# Patient Record
Sex: Female | Born: 1945 | Race: White | Hispanic: No | Marital: Married | Smoking: Never smoker
Health system: Southern US, Community
[De-identification: ages and names within clinical notes are randomized; demographics above are authoritative.]

## PROBLEM LIST (undated history)

## (undated) DIAGNOSIS — K219 Gastro-esophageal reflux disease without esophagitis: Secondary | ICD-10-CM

## (undated) DIAGNOSIS — M1990 Unspecified osteoarthritis, unspecified site: Secondary | ICD-10-CM

## (undated) DIAGNOSIS — R519 Headache, unspecified: Secondary | ICD-10-CM

## (undated) DIAGNOSIS — F419 Anxiety disorder, unspecified: Secondary | ICD-10-CM

## (undated) DIAGNOSIS — F329 Major depressive disorder, single episode, unspecified: Secondary | ICD-10-CM

## (undated) DIAGNOSIS — R112 Nausea with vomiting, unspecified: Secondary | ICD-10-CM

## (undated) DIAGNOSIS — R51 Headache: Secondary | ICD-10-CM

## (undated) DIAGNOSIS — F32A Depression, unspecified: Secondary | ICD-10-CM

## (undated) DIAGNOSIS — Z9889 Other specified postprocedural states: Secondary | ICD-10-CM

## (undated) HISTORY — PX: BREAST SURGERY: SHX581

---
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
## 1980-12-03 HISTORY — PX: OVARIAN CYST REMOVAL: SHX89

## 2011-12-04 HISTORY — PX: CHOLECYSTECTOMY: SHX55

## 2011-12-04 HISTORY — PX: APPENDECTOMY: SHX54

## 2012-12-10 DIAGNOSIS — M542 Cervicalgia: Secondary | ICD-10-CM | POA: Diagnosis not present

## 2012-12-10 DIAGNOSIS — M5412 Radiculopathy, cervical region: Secondary | ICD-10-CM | POA: Diagnosis not present

## 2012-12-10 DIAGNOSIS — M47812 Spondylosis without myelopathy or radiculopathy, cervical region: Secondary | ICD-10-CM | POA: Diagnosis not present

## 2013-04-01 DIAGNOSIS — D229 Melanocytic nevi, unspecified: Secondary | ICD-10-CM

## 2013-04-01 DIAGNOSIS — D485 Neoplasm of uncertain behavior of skin: Secondary | ICD-10-CM | POA: Diagnosis not present

## 2013-04-01 DIAGNOSIS — L570 Actinic keratosis: Secondary | ICD-10-CM | POA: Diagnosis not present

## 2013-04-01 HISTORY — DX: Melanocytic nevi, unspecified: D22.9

## 2013-04-15 DIAGNOSIS — N644 Mastodynia: Secondary | ICD-10-CM | POA: Diagnosis not present

## 2013-04-15 DIAGNOSIS — N6459 Other signs and symptoms in breast: Secondary | ICD-10-CM | POA: Diagnosis not present

## 2013-06-17 DIAGNOSIS — M47812 Spondylosis without myelopathy or radiculopathy, cervical region: Secondary | ICD-10-CM | POA: Diagnosis not present

## 2013-06-17 DIAGNOSIS — M542 Cervicalgia: Secondary | ICD-10-CM | POA: Diagnosis not present

## 2013-06-24 DIAGNOSIS — D239 Other benign neoplasm of skin, unspecified: Secondary | ICD-10-CM | POA: Diagnosis not present

## 2013-06-24 DIAGNOSIS — L570 Actinic keratosis: Secondary | ICD-10-CM | POA: Diagnosis not present

## 2013-09-15 DIAGNOSIS — Z124 Encounter for screening for malignant neoplasm of cervix: Secondary | ICD-10-CM | POA: Diagnosis not present

## 2013-09-15 DIAGNOSIS — N899 Noninflammatory disorder of vagina, unspecified: Secondary | ICD-10-CM | POA: Diagnosis not present

## 2013-09-15 DIAGNOSIS — Z01419 Encounter for gynecological examination (general) (routine) without abnormal findings: Secondary | ICD-10-CM | POA: Diagnosis not present

## 2013-09-16 DIAGNOSIS — M542 Cervicalgia: Secondary | ICD-10-CM | POA: Diagnosis not present

## 2013-09-16 DIAGNOSIS — M47812 Spondylosis without myelopathy or radiculopathy, cervical region: Secondary | ICD-10-CM | POA: Diagnosis not present

## 2013-10-14 DIAGNOSIS — L820 Inflamed seborrheic keratosis: Secondary | ICD-10-CM | POA: Diagnosis not present

## 2013-10-14 DIAGNOSIS — L570 Actinic keratosis: Secondary | ICD-10-CM | POA: Diagnosis not present

## 2013-10-27 DIAGNOSIS — M542 Cervicalgia: Secondary | ICD-10-CM | POA: Diagnosis not present

## 2013-10-27 DIAGNOSIS — M47812 Spondylosis without myelopathy or radiculopathy, cervical region: Secondary | ICD-10-CM | POA: Diagnosis not present

## 2013-11-10 DIAGNOSIS — Z1231 Encounter for screening mammogram for malignant neoplasm of breast: Secondary | ICD-10-CM | POA: Diagnosis not present

## 2013-12-01 DIAGNOSIS — M542 Cervicalgia: Secondary | ICD-10-CM | POA: Diagnosis not present

## 2013-12-01 DIAGNOSIS — M47812 Spondylosis without myelopathy or radiculopathy, cervical region: Secondary | ICD-10-CM | POA: Diagnosis not present

## 2013-12-15 DIAGNOSIS — L570 Actinic keratosis: Secondary | ICD-10-CM | POA: Diagnosis not present

## 2014-01-27 DIAGNOSIS — M5412 Radiculopathy, cervical region: Secondary | ICD-10-CM | POA: Diagnosis not present

## 2014-01-27 DIAGNOSIS — M542 Cervicalgia: Secondary | ICD-10-CM | POA: Diagnosis not present

## 2014-01-27 DIAGNOSIS — M47812 Spondylosis without myelopathy or radiculopathy, cervical region: Secondary | ICD-10-CM | POA: Diagnosis not present

## 2014-02-09 DIAGNOSIS — M5412 Radiculopathy, cervical region: Secondary | ICD-10-CM | POA: Diagnosis not present

## 2014-02-09 DIAGNOSIS — M542 Cervicalgia: Secondary | ICD-10-CM | POA: Diagnosis not present

## 2014-02-12 DIAGNOSIS — L570 Actinic keratosis: Secondary | ICD-10-CM | POA: Diagnosis not present

## 2014-03-07 DIAGNOSIS — S93409A Sprain of unspecified ligament of unspecified ankle, initial encounter: Secondary | ICD-10-CM | POA: Diagnosis not present

## 2014-03-07 DIAGNOSIS — M25579 Pain in unspecified ankle and joints of unspecified foot: Secondary | ICD-10-CM | POA: Diagnosis not present

## 2014-03-07 DIAGNOSIS — W010XXA Fall on same level from slipping, tripping and stumbling without subsequent striking against object, initial encounter: Secondary | ICD-10-CM | POA: Diagnosis not present

## 2014-03-07 DIAGNOSIS — S2249XA Multiple fractures of ribs, unspecified side, initial encounter for closed fracture: Secondary | ICD-10-CM | POA: Diagnosis not present

## 2014-03-07 DIAGNOSIS — Z87891 Personal history of nicotine dependence: Secondary | ICD-10-CM | POA: Diagnosis not present

## 2014-03-07 DIAGNOSIS — Y92009 Unspecified place in unspecified non-institutional (private) residence as the place of occurrence of the external cause: Secondary | ICD-10-CM | POA: Diagnosis not present

## 2014-03-07 DIAGNOSIS — S2239XA Fracture of one rib, unspecified side, initial encounter for closed fracture: Secondary | ICD-10-CM | POA: Diagnosis not present

## 2014-03-11 DIAGNOSIS — S9000XA Contusion of unspecified ankle, initial encounter: Secondary | ICD-10-CM | POA: Diagnosis not present

## 2014-03-11 DIAGNOSIS — S2249XA Multiple fractures of ribs, unspecified side, initial encounter for closed fracture: Secondary | ICD-10-CM | POA: Diagnosis not present

## 2014-03-18 DIAGNOSIS — R109 Unspecified abdominal pain: Secondary | ICD-10-CM | POA: Diagnosis not present

## 2014-03-18 DIAGNOSIS — R350 Frequency of micturition: Secondary | ICD-10-CM | POA: Diagnosis not present

## 2014-03-18 DIAGNOSIS — S2239XA Fracture of one rib, unspecified side, initial encounter for closed fracture: Secondary | ICD-10-CM | POA: Diagnosis not present

## 2014-03-18 DIAGNOSIS — N3000 Acute cystitis without hematuria: Secondary | ICD-10-CM | POA: Diagnosis not present

## 2014-03-19 DIAGNOSIS — M47812 Spondylosis without myelopathy or radiculopathy, cervical region: Secondary | ICD-10-CM | POA: Diagnosis not present

## 2014-03-19 DIAGNOSIS — M5412 Radiculopathy, cervical region: Secondary | ICD-10-CM | POA: Diagnosis not present

## 2014-03-19 DIAGNOSIS — Q7649 Other congenital malformations of spine, not associated with scoliosis: Secondary | ICD-10-CM | POA: Diagnosis not present

## 2014-03-19 DIAGNOSIS — M542 Cervicalgia: Secondary | ICD-10-CM | POA: Diagnosis not present

## 2014-03-22 ENCOUNTER — Other Ambulatory Visit: Payer: Self-pay | Admitting: Orthopedic Surgery

## 2014-03-22 DIAGNOSIS — M542 Cervicalgia: Secondary | ICD-10-CM

## 2014-03-23 ENCOUNTER — Ambulatory Visit
Admission: RE | Admit: 2014-03-23 | Discharge: 2014-03-23 | Disposition: A | Discharge status: Home / Self Care | Payer: Medicare Other | Source: Ambulatory Visit | Attending: Orthopedic Surgery | Admitting: Orthopedic Surgery

## 2014-03-23 DIAGNOSIS — M542 Cervicalgia: Secondary | ICD-10-CM

## 2014-03-23 DIAGNOSIS — M47817 Spondylosis without myelopathy or radiculopathy, lumbosacral region: Secondary | ICD-10-CM | POA: Diagnosis not present

## 2014-04-07 DIAGNOSIS — S2239XA Fracture of one rib, unspecified side, initial encounter for closed fracture: Secondary | ICD-10-CM | POA: Diagnosis not present

## 2014-04-07 DIAGNOSIS — S93409A Sprain of unspecified ligament of unspecified ankle, initial encounter: Secondary | ICD-10-CM | POA: Diagnosis not present

## 2014-04-09 DIAGNOSIS — M4712 Other spondylosis with myelopathy, cervical region: Secondary | ICD-10-CM | POA: Diagnosis not present

## 2014-04-09 DIAGNOSIS — M5412 Radiculopathy, cervical region: Secondary | ICD-10-CM | POA: Diagnosis not present

## 2014-04-14 DIAGNOSIS — D485 Neoplasm of uncertain behavior of skin: Secondary | ICD-10-CM | POA: Diagnosis not present

## 2014-04-14 DIAGNOSIS — L570 Actinic keratosis: Secondary | ICD-10-CM | POA: Diagnosis not present

## 2014-04-14 DIAGNOSIS — M5412 Radiculopathy, cervical region: Secondary | ICD-10-CM | POA: Diagnosis not present

## 2014-04-14 DIAGNOSIS — M4712 Other spondylosis with myelopathy, cervical region: Secondary | ICD-10-CM | POA: Diagnosis not present

## 2014-04-14 DIAGNOSIS — M542 Cervicalgia: Secondary | ICD-10-CM | POA: Diagnosis not present

## 2014-06-15 DIAGNOSIS — M25579 Pain in unspecified ankle and joints of unspecified foot: Secondary | ICD-10-CM | POA: Diagnosis not present

## 2014-06-15 DIAGNOSIS — M79609 Pain in unspecified limb: Secondary | ICD-10-CM | POA: Diagnosis not present

## 2014-06-15 DIAGNOSIS — S93409A Sprain of unspecified ligament of unspecified ankle, initial encounter: Secondary | ICD-10-CM | POA: Diagnosis not present

## 2014-06-15 DIAGNOSIS — S93609A Unspecified sprain of unspecified foot, initial encounter: Secondary | ICD-10-CM | POA: Diagnosis not present

## 2014-06-24 DIAGNOSIS — M25579 Pain in unspecified ankle and joints of unspecified foot: Secondary | ICD-10-CM | POA: Diagnosis not present

## 2014-06-30 DIAGNOSIS — M19079 Primary osteoarthritis, unspecified ankle and foot: Secondary | ICD-10-CM | POA: Diagnosis not present

## 2014-06-30 DIAGNOSIS — M79609 Pain in unspecified limb: Secondary | ICD-10-CM | POA: Diagnosis not present

## 2014-06-30 DIAGNOSIS — Z5189 Encounter for other specified aftercare: Secondary | ICD-10-CM | POA: Diagnosis not present

## 2014-07-21 DIAGNOSIS — M19079 Primary osteoarthritis, unspecified ankle and foot: Secondary | ICD-10-CM | POA: Diagnosis not present

## 2014-07-21 DIAGNOSIS — M7750 Other enthesopathy of unspecified foot: Secondary | ICD-10-CM | POA: Diagnosis not present

## 2014-07-27 DIAGNOSIS — M19079 Primary osteoarthritis, unspecified ankle and foot: Secondary | ICD-10-CM | POA: Diagnosis not present

## 2014-07-27 DIAGNOSIS — R262 Difficulty in walking, not elsewhere classified: Secondary | ICD-10-CM | POA: Diagnosis not present

## 2014-07-27 DIAGNOSIS — M25569 Pain in unspecified knee: Secondary | ICD-10-CM | POA: Diagnosis not present

## 2014-07-30 DIAGNOSIS — M25569 Pain in unspecified knee: Secondary | ICD-10-CM | POA: Diagnosis not present

## 2014-07-30 DIAGNOSIS — R262 Difficulty in walking, not elsewhere classified: Secondary | ICD-10-CM | POA: Diagnosis not present

## 2014-07-30 DIAGNOSIS — M19079 Primary osteoarthritis, unspecified ankle and foot: Secondary | ICD-10-CM | POA: Diagnosis not present

## 2014-08-02 DIAGNOSIS — M25569 Pain in unspecified knee: Secondary | ICD-10-CM | POA: Diagnosis not present

## 2014-08-02 DIAGNOSIS — M19079 Primary osteoarthritis, unspecified ankle and foot: Secondary | ICD-10-CM | POA: Diagnosis not present

## 2014-08-02 DIAGNOSIS — R262 Difficulty in walking, not elsewhere classified: Secondary | ICD-10-CM | POA: Diagnosis not present

## 2014-08-04 DIAGNOSIS — M25569 Pain in unspecified knee: Secondary | ICD-10-CM | POA: Diagnosis not present

## 2014-08-04 DIAGNOSIS — M19079 Primary osteoarthritis, unspecified ankle and foot: Secondary | ICD-10-CM | POA: Diagnosis not present

## 2014-08-04 DIAGNOSIS — R262 Difficulty in walking, not elsewhere classified: Secondary | ICD-10-CM | POA: Diagnosis not present

## 2014-08-11 DIAGNOSIS — M19079 Primary osteoarthritis, unspecified ankle and foot: Secondary | ICD-10-CM | POA: Diagnosis not present

## 2014-08-11 DIAGNOSIS — R262 Difficulty in walking, not elsewhere classified: Secondary | ICD-10-CM | POA: Diagnosis not present

## 2014-08-11 DIAGNOSIS — M25569 Pain in unspecified knee: Secondary | ICD-10-CM | POA: Diagnosis not present

## 2014-08-13 DIAGNOSIS — R262 Difficulty in walking, not elsewhere classified: Secondary | ICD-10-CM | POA: Diagnosis not present

## 2014-08-13 DIAGNOSIS — M19079 Primary osteoarthritis, unspecified ankle and foot: Secondary | ICD-10-CM | POA: Diagnosis not present

## 2014-08-13 DIAGNOSIS — M25569 Pain in unspecified knee: Secondary | ICD-10-CM | POA: Diagnosis not present

## 2014-08-16 DIAGNOSIS — M19079 Primary osteoarthritis, unspecified ankle and foot: Secondary | ICD-10-CM | POA: Diagnosis not present

## 2014-08-16 DIAGNOSIS — R262 Difficulty in walking, not elsewhere classified: Secondary | ICD-10-CM | POA: Diagnosis not present

## 2014-08-16 DIAGNOSIS — M25569 Pain in unspecified knee: Secondary | ICD-10-CM | POA: Diagnosis not present

## 2014-08-18 DIAGNOSIS — M19079 Primary osteoarthritis, unspecified ankle and foot: Secondary | ICD-10-CM | POA: Diagnosis not present

## 2014-08-18 DIAGNOSIS — R262 Difficulty in walking, not elsewhere classified: Secondary | ICD-10-CM | POA: Diagnosis not present

## 2014-08-18 DIAGNOSIS — M25569 Pain in unspecified knee: Secondary | ICD-10-CM | POA: Diagnosis not present

## 2014-08-20 DIAGNOSIS — R262 Difficulty in walking, not elsewhere classified: Secondary | ICD-10-CM | POA: Diagnosis not present

## 2014-08-20 DIAGNOSIS — M19079 Primary osteoarthritis, unspecified ankle and foot: Secondary | ICD-10-CM | POA: Diagnosis not present

## 2014-08-20 DIAGNOSIS — M25569 Pain in unspecified knee: Secondary | ICD-10-CM | POA: Diagnosis not present

## 2014-08-23 DIAGNOSIS — M19079 Primary osteoarthritis, unspecified ankle and foot: Secondary | ICD-10-CM | POA: Diagnosis not present

## 2014-08-23 DIAGNOSIS — M7750 Other enthesopathy of unspecified foot: Secondary | ICD-10-CM | POA: Diagnosis not present

## 2014-09-10 DIAGNOSIS — M25572 Pain in left ankle and joints of left foot: Secondary | ICD-10-CM | POA: Diagnosis not present

## 2014-09-10 DIAGNOSIS — M6281 Muscle weakness (generalized): Secondary | ICD-10-CM | POA: Diagnosis not present

## 2014-09-10 DIAGNOSIS — R262 Difficulty in walking, not elsewhere classified: Secondary | ICD-10-CM | POA: Diagnosis not present

## 2014-09-15 DIAGNOSIS — M6281 Muscle weakness (generalized): Secondary | ICD-10-CM | POA: Diagnosis not present

## 2014-09-15 DIAGNOSIS — M25572 Pain in left ankle and joints of left foot: Secondary | ICD-10-CM | POA: Diagnosis not present

## 2014-09-15 DIAGNOSIS — R262 Difficulty in walking, not elsewhere classified: Secondary | ICD-10-CM | POA: Diagnosis not present

## 2014-09-16 DIAGNOSIS — M6281 Muscle weakness (generalized): Secondary | ICD-10-CM | POA: Diagnosis not present

## 2014-09-16 DIAGNOSIS — M25572 Pain in left ankle and joints of left foot: Secondary | ICD-10-CM | POA: Diagnosis not present

## 2014-09-16 DIAGNOSIS — R262 Difficulty in walking, not elsewhere classified: Secondary | ICD-10-CM | POA: Diagnosis not present

## 2014-09-21 DIAGNOSIS — M6281 Muscle weakness (generalized): Secondary | ICD-10-CM | POA: Diagnosis not present

## 2014-09-21 DIAGNOSIS — M25572 Pain in left ankle and joints of left foot: Secondary | ICD-10-CM | POA: Diagnosis not present

## 2014-09-21 DIAGNOSIS — R262 Difficulty in walking, not elsewhere classified: Secondary | ICD-10-CM | POA: Diagnosis not present

## 2014-09-22 DIAGNOSIS — M25572 Pain in left ankle and joints of left foot: Secondary | ICD-10-CM | POA: Diagnosis not present

## 2014-09-22 DIAGNOSIS — R262 Difficulty in walking, not elsewhere classified: Secondary | ICD-10-CM | POA: Diagnosis not present

## 2014-09-22 DIAGNOSIS — M6281 Muscle weakness (generalized): Secondary | ICD-10-CM | POA: Diagnosis not present

## 2014-09-27 DIAGNOSIS — R262 Difficulty in walking, not elsewhere classified: Secondary | ICD-10-CM | POA: Diagnosis not present

## 2014-09-27 DIAGNOSIS — M25572 Pain in left ankle and joints of left foot: Secondary | ICD-10-CM | POA: Diagnosis not present

## 2014-09-27 DIAGNOSIS — M6281 Muscle weakness (generalized): Secondary | ICD-10-CM | POA: Diagnosis not present

## 2014-09-28 DIAGNOSIS — Z23 Encounter for immunization: Secondary | ICD-10-CM | POA: Diagnosis not present

## 2014-09-28 DIAGNOSIS — Z01419 Encounter for gynecological examination (general) (routine) without abnormal findings: Secondary | ICD-10-CM | POA: Diagnosis not present

## 2014-09-28 DIAGNOSIS — Z124 Encounter for screening for malignant neoplasm of cervix: Secondary | ICD-10-CM | POA: Diagnosis not present

## 2014-09-28 DIAGNOSIS — R6889 Other general symptoms and signs: Secondary | ICD-10-CM | POA: Diagnosis not present

## 2014-09-28 DIAGNOSIS — Z1151 Encounter for screening for human papillomavirus (HPV): Secondary | ICD-10-CM | POA: Diagnosis not present

## 2014-09-30 DIAGNOSIS — M25572 Pain in left ankle and joints of left foot: Secondary | ICD-10-CM | POA: Diagnosis not present

## 2014-09-30 DIAGNOSIS — M6281 Muscle weakness (generalized): Secondary | ICD-10-CM | POA: Diagnosis not present

## 2014-09-30 DIAGNOSIS — R262 Difficulty in walking, not elsewhere classified: Secondary | ICD-10-CM | POA: Diagnosis not present

## 2014-10-04 DIAGNOSIS — M7672 Peroneal tendinitis, left leg: Secondary | ICD-10-CM | POA: Diagnosis not present

## 2014-10-04 DIAGNOSIS — M19072 Primary osteoarthritis, left ankle and foot: Secondary | ICD-10-CM | POA: Diagnosis not present

## 2014-11-04 DIAGNOSIS — C44320 Squamous cell carcinoma of skin of unspecified parts of face: Secondary | ICD-10-CM | POA: Diagnosis not present

## 2014-11-04 DIAGNOSIS — L570 Actinic keratosis: Secondary | ICD-10-CM | POA: Diagnosis not present

## 2014-11-04 DIAGNOSIS — C4492 Squamous cell carcinoma of skin, unspecified: Secondary | ICD-10-CM

## 2014-11-04 DIAGNOSIS — D485 Neoplasm of uncertain behavior of skin: Secondary | ICD-10-CM | POA: Diagnosis not present

## 2014-11-04 DIAGNOSIS — C44321 Squamous cell carcinoma of skin of nose: Secondary | ICD-10-CM | POA: Diagnosis not present

## 2014-11-04 HISTORY — DX: Squamous cell carcinoma of skin, unspecified: C44.92

## 2014-11-08 DIAGNOSIS — J0110 Acute frontal sinusitis, unspecified: Secondary | ICD-10-CM | POA: Diagnosis not present

## 2014-11-16 DIAGNOSIS — E2839 Other primary ovarian failure: Secondary | ICD-10-CM | POA: Diagnosis not present

## 2014-11-16 DIAGNOSIS — Z1231 Encounter for screening mammogram for malignant neoplasm of breast: Secondary | ICD-10-CM | POA: Diagnosis not present

## 2014-12-09 DIAGNOSIS — J329 Chronic sinusitis, unspecified: Secondary | ICD-10-CM | POA: Diagnosis not present

## 2014-12-15 DIAGNOSIS — J3489 Other specified disorders of nose and nasal sinuses: Secondary | ICD-10-CM | POA: Diagnosis not present

## 2014-12-15 DIAGNOSIS — J329 Chronic sinusitis, unspecified: Secondary | ICD-10-CM | POA: Diagnosis not present

## 2014-12-30 DIAGNOSIS — B379 Candidiasis, unspecified: Secondary | ICD-10-CM | POA: Diagnosis not present

## 2014-12-30 DIAGNOSIS — J310 Chronic rhinitis: Secondary | ICD-10-CM | POA: Diagnosis not present

## 2015-02-01 DIAGNOSIS — L570 Actinic keratosis: Secondary | ICD-10-CM | POA: Diagnosis not present

## 2015-02-01 DIAGNOSIS — D239 Other benign neoplasm of skin, unspecified: Secondary | ICD-10-CM | POA: Diagnosis not present

## 2015-03-09 DIAGNOSIS — H01024 Squamous blepharitis left upper eyelid: Secondary | ICD-10-CM | POA: Diagnosis not present

## 2015-03-09 DIAGNOSIS — H01022 Squamous blepharitis right lower eyelid: Secondary | ICD-10-CM | POA: Diagnosis not present

## 2015-03-09 DIAGNOSIS — H01025 Squamous blepharitis left lower eyelid: Secondary | ICD-10-CM | POA: Diagnosis not present

## 2015-03-09 DIAGNOSIS — H01021 Squamous blepharitis right upper eyelid: Secondary | ICD-10-CM | POA: Diagnosis not present

## 2015-03-29 DIAGNOSIS — E782 Mixed hyperlipidemia: Secondary | ICD-10-CM | POA: Diagnosis not present

## 2015-03-29 DIAGNOSIS — R300 Dysuria: Secondary | ICD-10-CM | POA: Diagnosis not present

## 2015-03-29 DIAGNOSIS — M25572 Pain in left ankle and joints of left foot: Secondary | ICD-10-CM | POA: Diagnosis not present

## 2015-03-29 DIAGNOSIS — Z1389 Encounter for screening for other disorder: Secondary | ICD-10-CM | POA: Diagnosis not present

## 2015-03-29 DIAGNOSIS — K259 Gastric ulcer, unspecified as acute or chronic, without hemorrhage or perforation: Secondary | ICD-10-CM | POA: Diagnosis not present

## 2015-03-29 DIAGNOSIS — M2550 Pain in unspecified joint: Secondary | ICD-10-CM | POA: Diagnosis not present

## 2015-04-06 DIAGNOSIS — R300 Dysuria: Secondary | ICD-10-CM | POA: Diagnosis not present

## 2015-04-06 DIAGNOSIS — D519 Vitamin B12 deficiency anemia, unspecified: Secondary | ICD-10-CM | POA: Diagnosis not present

## 2015-04-06 DIAGNOSIS — E782 Mixed hyperlipidemia: Secondary | ICD-10-CM | POA: Diagnosis not present

## 2015-04-06 DIAGNOSIS — K259 Gastric ulcer, unspecified as acute or chronic, without hemorrhage or perforation: Secondary | ICD-10-CM | POA: Diagnosis not present

## 2015-04-25 DIAGNOSIS — M25572 Pain in left ankle and joints of left foot: Secondary | ICD-10-CM | POA: Diagnosis not present

## 2015-04-25 DIAGNOSIS — R300 Dysuria: Secondary | ICD-10-CM | POA: Diagnosis not present

## 2015-04-25 DIAGNOSIS — L718 Other rosacea: Secondary | ICD-10-CM | POA: Diagnosis not present

## 2015-04-25 DIAGNOSIS — Z23 Encounter for immunization: Secondary | ICD-10-CM | POA: Diagnosis not present

## 2015-04-25 DIAGNOSIS — M1990 Unspecified osteoarthritis, unspecified site: Secondary | ICD-10-CM | POA: Diagnosis not present

## 2015-04-25 DIAGNOSIS — M2550 Pain in unspecified joint: Secondary | ICD-10-CM | POA: Diagnosis not present

## 2015-04-25 DIAGNOSIS — E782 Mixed hyperlipidemia: Secondary | ICD-10-CM | POA: Diagnosis not present

## 2015-04-25 DIAGNOSIS — K259 Gastric ulcer, unspecified as acute or chronic, without hemorrhage or perforation: Secondary | ICD-10-CM | POA: Diagnosis not present

## 2015-04-29 DIAGNOSIS — L905 Scar conditions and fibrosis of skin: Secondary | ICD-10-CM | POA: Diagnosis not present

## 2015-04-29 DIAGNOSIS — D485 Neoplasm of uncertain behavior of skin: Secondary | ICD-10-CM | POA: Diagnosis not present

## 2015-04-29 DIAGNOSIS — L570 Actinic keratosis: Secondary | ICD-10-CM | POA: Diagnosis not present

## 2015-05-12 DIAGNOSIS — H01024 Squamous blepharitis left upper eyelid: Secondary | ICD-10-CM | POA: Diagnosis not present

## 2015-05-12 DIAGNOSIS — H01025 Squamous blepharitis left lower eyelid: Secondary | ICD-10-CM | POA: Diagnosis not present

## 2015-05-12 DIAGNOSIS — H01022 Squamous blepharitis right lower eyelid: Secondary | ICD-10-CM | POA: Diagnosis not present

## 2015-05-12 DIAGNOSIS — H01021 Squamous blepharitis right upper eyelid: Secondary | ICD-10-CM | POA: Diagnosis not present

## 2015-06-17 DIAGNOSIS — N952 Postmenopausal atrophic vaginitis: Secondary | ICD-10-CM | POA: Diagnosis not present

## 2015-06-17 DIAGNOSIS — N3281 Overactive bladder: Secondary | ICD-10-CM | POA: Diagnosis not present

## 2015-06-30 DIAGNOSIS — D485 Neoplasm of uncertain behavior of skin: Secondary | ICD-10-CM | POA: Diagnosis not present

## 2015-06-30 DIAGNOSIS — L570 Actinic keratosis: Secondary | ICD-10-CM | POA: Diagnosis not present

## 2015-07-06 DIAGNOSIS — H01024 Squamous blepharitis left upper eyelid: Secondary | ICD-10-CM | POA: Diagnosis not present

## 2015-07-06 DIAGNOSIS — H01021 Squamous blepharitis right upper eyelid: Secondary | ICD-10-CM | POA: Diagnosis not present

## 2015-07-06 DIAGNOSIS — H01025 Squamous blepharitis left lower eyelid: Secondary | ICD-10-CM | POA: Diagnosis not present

## 2015-07-20 DIAGNOSIS — M7672 Peroneal tendinitis, left leg: Secondary | ICD-10-CM | POA: Diagnosis not present

## 2015-07-20 DIAGNOSIS — M19072 Primary osteoarthritis, left ankle and foot: Secondary | ICD-10-CM | POA: Diagnosis not present

## 2015-07-26 DIAGNOSIS — E782 Mixed hyperlipidemia: Secondary | ICD-10-CM | POA: Diagnosis not present

## 2015-07-26 DIAGNOSIS — K259 Gastric ulcer, unspecified as acute or chronic, without hemorrhage or perforation: Secondary | ICD-10-CM | POA: Diagnosis not present

## 2015-07-28 DIAGNOSIS — M2550 Pain in unspecified joint: Secondary | ICD-10-CM | POA: Diagnosis not present

## 2015-07-28 DIAGNOSIS — M6281 Muscle weakness (generalized): Secondary | ICD-10-CM | POA: Diagnosis not present

## 2015-07-28 DIAGNOSIS — R262 Difficulty in walking, not elsewhere classified: Secondary | ICD-10-CM | POA: Diagnosis not present

## 2015-08-02 DIAGNOSIS — E782 Mixed hyperlipidemia: Secondary | ICD-10-CM | POA: Diagnosis not present

## 2015-08-02 DIAGNOSIS — M150 Primary generalized (osteo)arthritis: Secondary | ICD-10-CM | POA: Diagnosis not present

## 2015-08-02 DIAGNOSIS — R002 Palpitations: Secondary | ICD-10-CM | POA: Diagnosis not present

## 2015-08-02 DIAGNOSIS — M6281 Muscle weakness (generalized): Secondary | ICD-10-CM | POA: Diagnosis not present

## 2015-08-02 DIAGNOSIS — M2550 Pain in unspecified joint: Secondary | ICD-10-CM | POA: Diagnosis not present

## 2015-08-02 DIAGNOSIS — R262 Difficulty in walking, not elsewhere classified: Secondary | ICD-10-CM | POA: Diagnosis not present

## 2015-08-02 DIAGNOSIS — S8412XS Injury of peroneal nerve at lower leg level, left leg, sequela: Secondary | ICD-10-CM | POA: Diagnosis not present

## 2015-08-02 DIAGNOSIS — L718 Other rosacea: Secondary | ICD-10-CM | POA: Diagnosis not present

## 2015-08-04 DIAGNOSIS — M2550 Pain in unspecified joint: Secondary | ICD-10-CM | POA: Diagnosis not present

## 2015-08-04 DIAGNOSIS — R262 Difficulty in walking, not elsewhere classified: Secondary | ICD-10-CM | POA: Diagnosis not present

## 2015-08-04 DIAGNOSIS — M6281 Muscle weakness (generalized): Secondary | ICD-10-CM | POA: Diagnosis not present

## 2015-08-09 DIAGNOSIS — M6281 Muscle weakness (generalized): Secondary | ICD-10-CM | POA: Diagnosis not present

## 2015-08-09 DIAGNOSIS — R262 Difficulty in walking, not elsewhere classified: Secondary | ICD-10-CM | POA: Diagnosis not present

## 2015-08-09 DIAGNOSIS — M2550 Pain in unspecified joint: Secondary | ICD-10-CM | POA: Diagnosis not present

## 2015-08-15 DIAGNOSIS — M19072 Primary osteoarthritis, left ankle and foot: Secondary | ICD-10-CM | POA: Diagnosis not present

## 2015-08-16 DIAGNOSIS — M2550 Pain in unspecified joint: Secondary | ICD-10-CM | POA: Diagnosis not present

## 2015-08-16 DIAGNOSIS — R262 Difficulty in walking, not elsewhere classified: Secondary | ICD-10-CM | POA: Diagnosis not present

## 2015-08-16 DIAGNOSIS — M6281 Muscle weakness (generalized): Secondary | ICD-10-CM | POA: Diagnosis not present

## 2015-08-18 DIAGNOSIS — M6281 Muscle weakness (generalized): Secondary | ICD-10-CM | POA: Diagnosis not present

## 2015-08-18 DIAGNOSIS — R262 Difficulty in walking, not elsewhere classified: Secondary | ICD-10-CM | POA: Diagnosis not present

## 2015-08-18 DIAGNOSIS — M2550 Pain in unspecified joint: Secondary | ICD-10-CM | POA: Diagnosis not present

## 2015-08-23 DIAGNOSIS — M6281 Muscle weakness (generalized): Secondary | ICD-10-CM | POA: Diagnosis not present

## 2015-08-23 DIAGNOSIS — M2550 Pain in unspecified joint: Secondary | ICD-10-CM | POA: Diagnosis not present

## 2015-08-23 DIAGNOSIS — R262 Difficulty in walking, not elsewhere classified: Secondary | ICD-10-CM | POA: Diagnosis not present

## 2015-08-25 DIAGNOSIS — M2550 Pain in unspecified joint: Secondary | ICD-10-CM | POA: Diagnosis not present

## 2015-08-25 DIAGNOSIS — M6281 Muscle weakness (generalized): Secondary | ICD-10-CM | POA: Diagnosis not present

## 2015-08-25 DIAGNOSIS — R262 Difficulty in walking, not elsewhere classified: Secondary | ICD-10-CM | POA: Diagnosis not present

## 2015-08-30 DIAGNOSIS — R262 Difficulty in walking, not elsewhere classified: Secondary | ICD-10-CM | POA: Diagnosis not present

## 2015-08-30 DIAGNOSIS — M6281 Muscle weakness (generalized): Secondary | ICD-10-CM | POA: Diagnosis not present

## 2015-08-30 DIAGNOSIS — M2550 Pain in unspecified joint: Secondary | ICD-10-CM | POA: Diagnosis not present

## 2015-09-02 DIAGNOSIS — M2550 Pain in unspecified joint: Secondary | ICD-10-CM | POA: Diagnosis not present

## 2015-09-02 DIAGNOSIS — R262 Difficulty in walking, not elsewhere classified: Secondary | ICD-10-CM | POA: Diagnosis not present

## 2015-09-02 DIAGNOSIS — M6281 Muscle weakness (generalized): Secondary | ICD-10-CM | POA: Diagnosis not present

## 2015-09-05 DIAGNOSIS — M6281 Muscle weakness (generalized): Secondary | ICD-10-CM | POA: Diagnosis not present

## 2015-09-05 DIAGNOSIS — R262 Difficulty in walking, not elsewhere classified: Secondary | ICD-10-CM | POA: Diagnosis not present

## 2015-09-05 DIAGNOSIS — M2550 Pain in unspecified joint: Secondary | ICD-10-CM | POA: Diagnosis not present

## 2015-09-13 DIAGNOSIS — M2550 Pain in unspecified joint: Secondary | ICD-10-CM | POA: Diagnosis not present

## 2015-09-13 DIAGNOSIS — M6281 Muscle weakness (generalized): Secondary | ICD-10-CM | POA: Diagnosis not present

## 2015-09-13 DIAGNOSIS — L570 Actinic keratosis: Secondary | ICD-10-CM | POA: Diagnosis not present

## 2015-09-13 DIAGNOSIS — R262 Difficulty in walking, not elsewhere classified: Secondary | ICD-10-CM | POA: Diagnosis not present

## 2015-09-15 DIAGNOSIS — M2550 Pain in unspecified joint: Secondary | ICD-10-CM | POA: Diagnosis not present

## 2015-09-15 DIAGNOSIS — M6281 Muscle weakness (generalized): Secondary | ICD-10-CM | POA: Diagnosis not present

## 2015-09-15 DIAGNOSIS — R262 Difficulty in walking, not elsewhere classified: Secondary | ICD-10-CM | POA: Diagnosis not present

## 2015-09-20 DIAGNOSIS — M2550 Pain in unspecified joint: Secondary | ICD-10-CM | POA: Diagnosis not present

## 2015-09-20 DIAGNOSIS — R262 Difficulty in walking, not elsewhere classified: Secondary | ICD-10-CM | POA: Diagnosis not present

## 2015-09-20 DIAGNOSIS — M6281 Muscle weakness (generalized): Secondary | ICD-10-CM | POA: Diagnosis not present

## 2015-09-23 DIAGNOSIS — R262 Difficulty in walking, not elsewhere classified: Secondary | ICD-10-CM | POA: Diagnosis not present

## 2015-09-23 DIAGNOSIS — M6281 Muscle weakness (generalized): Secondary | ICD-10-CM | POA: Diagnosis not present

## 2015-09-23 DIAGNOSIS — M2550 Pain in unspecified joint: Secondary | ICD-10-CM | POA: Diagnosis not present

## 2015-09-26 DIAGNOSIS — M2550 Pain in unspecified joint: Secondary | ICD-10-CM | POA: Diagnosis not present

## 2015-09-26 DIAGNOSIS — M6281 Muscle weakness (generalized): Secondary | ICD-10-CM | POA: Diagnosis not present

## 2015-09-26 DIAGNOSIS — R262 Difficulty in walking, not elsewhere classified: Secondary | ICD-10-CM | POA: Diagnosis not present

## 2015-09-29 DIAGNOSIS — M2550 Pain in unspecified joint: Secondary | ICD-10-CM | POA: Diagnosis not present

## 2015-09-29 DIAGNOSIS — M6281 Muscle weakness (generalized): Secondary | ICD-10-CM | POA: Diagnosis not present

## 2015-09-29 DIAGNOSIS — R262 Difficulty in walking, not elsewhere classified: Secondary | ICD-10-CM | POA: Diagnosis not present

## 2015-10-04 DIAGNOSIS — H01021 Squamous blepharitis right upper eyelid: Secondary | ICD-10-CM | POA: Diagnosis not present

## 2015-10-06 DIAGNOSIS — N952 Postmenopausal atrophic vaginitis: Secondary | ICD-10-CM | POA: Diagnosis not present

## 2015-10-06 DIAGNOSIS — Z23 Encounter for immunization: Secondary | ICD-10-CM | POA: Diagnosis not present

## 2015-10-06 DIAGNOSIS — Z01411 Encounter for gynecological examination (general) (routine) with abnormal findings: Secondary | ICD-10-CM | POA: Diagnosis not present

## 2015-10-06 DIAGNOSIS — M8580 Other specified disorders of bone density and structure, unspecified site: Secondary | ICD-10-CM | POA: Diagnosis not present

## 2015-10-06 DIAGNOSIS — M8589 Other specified disorders of bone density and structure, multiple sites: Secondary | ICD-10-CM | POA: Diagnosis not present

## 2015-10-06 DIAGNOSIS — Z124 Encounter for screening for malignant neoplasm of cervix: Secondary | ICD-10-CM | POA: Diagnosis not present

## 2015-10-10 DIAGNOSIS — M19072 Primary osteoarthritis, left ankle and foot: Secondary | ICD-10-CM | POA: Diagnosis not present

## 2015-10-10 DIAGNOSIS — M7672 Peroneal tendinitis, left leg: Secondary | ICD-10-CM | POA: Diagnosis not present

## 2015-10-21 DIAGNOSIS — E782 Mixed hyperlipidemia: Secondary | ICD-10-CM | POA: Diagnosis not present

## 2015-10-21 DIAGNOSIS — K259 Gastric ulcer, unspecified as acute or chronic, without hemorrhage or perforation: Secondary | ICD-10-CM | POA: Diagnosis not present

## 2015-10-21 DIAGNOSIS — E559 Vitamin D deficiency, unspecified: Secondary | ICD-10-CM | POA: Diagnosis not present

## 2015-11-02 DIAGNOSIS — R002 Palpitations: Secondary | ICD-10-CM | POA: Diagnosis not present

## 2015-11-02 DIAGNOSIS — F419 Anxiety disorder, unspecified: Secondary | ICD-10-CM | POA: Diagnosis not present

## 2015-11-02 DIAGNOSIS — L718 Other rosacea: Secondary | ICD-10-CM | POA: Diagnosis not present

## 2015-11-02 DIAGNOSIS — K259 Gastric ulcer, unspecified as acute or chronic, without hemorrhage or perforation: Secondary | ICD-10-CM | POA: Diagnosis not present

## 2015-11-02 DIAGNOSIS — S8412XS Injury of peroneal nerve at lower leg level, left leg, sequela: Secondary | ICD-10-CM | POA: Diagnosis not present

## 2015-11-14 DIAGNOSIS — M79672 Pain in left foot: Secondary | ICD-10-CM | POA: Diagnosis not present

## 2015-11-14 DIAGNOSIS — G5792 Unspecified mononeuropathy of left lower limb: Secondary | ICD-10-CM | POA: Diagnosis not present

## 2015-11-14 DIAGNOSIS — M79605 Pain in left leg: Secondary | ICD-10-CM | POA: Diagnosis not present

## 2015-12-08 DIAGNOSIS — M25572 Pain in left ankle and joints of left foot: Secondary | ICD-10-CM | POA: Diagnosis not present

## 2015-12-08 DIAGNOSIS — M79672 Pain in left foot: Secondary | ICD-10-CM | POA: Diagnosis not present

## 2015-12-13 DIAGNOSIS — K259 Gastric ulcer, unspecified as acute or chronic, without hemorrhage or perforation: Secondary | ICD-10-CM | POA: Diagnosis not present

## 2015-12-13 DIAGNOSIS — M859 Disorder of bone density and structure, unspecified: Secondary | ICD-10-CM | POA: Diagnosis not present

## 2015-12-14 ENCOUNTER — Other Ambulatory Visit: Payer: Self-pay | Admitting: Orthopedic Surgery

## 2015-12-19 ENCOUNTER — Other Ambulatory Visit: Payer: Self-pay | Admitting: Orthopedic Surgery

## 2015-12-19 DIAGNOSIS — M79605 Pain in left leg: Secondary | ICD-10-CM

## 2015-12-20 ENCOUNTER — Other Ambulatory Visit (HOSPITAL_COMMUNITY): Payer: Self-pay | Admitting: Orthopedic Surgery

## 2015-12-20 DIAGNOSIS — M79605 Pain in left leg: Secondary | ICD-10-CM

## 2015-12-27 DIAGNOSIS — M859 Disorder of bone density and structure, unspecified: Secondary | ICD-10-CM | POA: Diagnosis not present

## 2015-12-27 DIAGNOSIS — Z1231 Encounter for screening mammogram for malignant neoplasm of breast: Secondary | ICD-10-CM | POA: Diagnosis not present

## 2015-12-27 DIAGNOSIS — L570 Actinic keratosis: Secondary | ICD-10-CM | POA: Diagnosis not present

## 2015-12-27 DIAGNOSIS — K130 Diseases of lips: Secondary | ICD-10-CM | POA: Diagnosis not present

## 2015-12-28 ENCOUNTER — Encounter (HOSPITAL_COMMUNITY): Payer: Medicare Other

## 2015-12-29 ENCOUNTER — Other Ambulatory Visit: Payer: Self-pay | Admitting: Obstetrics

## 2016-01-02 ENCOUNTER — Encounter (HOSPITAL_COMMUNITY)
Admission: RE | Admit: 2016-01-02 | Discharge: 2016-01-02 | Disposition: A | Discharge status: Home / Self Care | Payer: Medicare Other | Source: Ambulatory Visit | Attending: Orthopedic Surgery | Admitting: Orthopedic Surgery

## 2016-01-02 DIAGNOSIS — M79602 Pain in left arm: Secondary | ICD-10-CM | POA: Diagnosis not present

## 2016-01-02 DIAGNOSIS — M79672 Pain in left foot: Secondary | ICD-10-CM | POA: Diagnosis not present

## 2016-01-02 DIAGNOSIS — M79605 Pain in left leg: Secondary | ICD-10-CM | POA: Insufficient documentation

## 2016-01-02 MED ORDER — TECHNETIUM TC 99M MEDRONATE IV KIT
25.0000 | PACK | Freq: Once | INTRAVENOUS | Status: AC | PRN
  Administered 2016-01-02: 25 via INTRAVENOUS

## 2016-01-10 DIAGNOSIS — M25572 Pain in left ankle and joints of left foot: Secondary | ICD-10-CM | POA: Diagnosis not present

## 2016-01-12 ENCOUNTER — Other Ambulatory Visit (HOSPITAL_COMMUNITY): Payer: Self-pay | Admitting: Orthopaedic Surgery

## 2016-01-12 DIAGNOSIS — M19079 Primary osteoarthritis, unspecified ankle and foot: Secondary | ICD-10-CM

## 2016-01-16 ENCOUNTER — Ambulatory Visit (HOSPITAL_COMMUNITY): Payer: Medicare Other

## 2016-01-19 ENCOUNTER — Other Ambulatory Visit: Payer: Self-pay | Admitting: Orthopaedic Surgery

## 2016-01-19 ENCOUNTER — Ambulatory Visit (HOSPITAL_COMMUNITY): Admission: RE | Admit: 2016-01-19 | Payer: Medicare Other | Source: Ambulatory Visit

## 2016-01-19 DIAGNOSIS — M25572 Pain in left ankle and joints of left foot: Secondary | ICD-10-CM

## 2016-01-24 DIAGNOSIS — M79672 Pain in left foot: Secondary | ICD-10-CM | POA: Diagnosis not present

## 2016-01-24 DIAGNOSIS — M7742 Metatarsalgia, left foot: Secondary | ICD-10-CM | POA: Diagnosis not present

## 2016-01-26 ENCOUNTER — Ambulatory Visit
Admission: RE | Admit: 2016-01-26 | Discharge: 2016-01-26 | Disposition: A | Discharge status: Home / Self Care | Payer: Medicare Other | Source: Ambulatory Visit | Attending: Orthopaedic Surgery | Admitting: Orthopaedic Surgery

## 2016-01-26 DIAGNOSIS — M25572 Pain in left ankle and joints of left foot: Secondary | ICD-10-CM | POA: Diagnosis not present

## 2016-01-26 DIAGNOSIS — M810 Age-related osteoporosis without current pathological fracture: Secondary | ICD-10-CM | POA: Diagnosis not present

## 2016-01-26 MED ORDER — IOHEXOL 180 MG/ML  SOLN: 1.0000 mL | Freq: Once | INTRAMUSCULAR | Status: DC | PRN

## 2016-01-26 MED ORDER — METHYLPREDNISOLONE ACETATE 40 MG/ML INJ SUSP (RADIOLOG: 120.0000 mg | Freq: Once | INTRAMUSCULAR | Status: DC

## 2016-01-31 ENCOUNTER — Other Ambulatory Visit: Payer: Self-pay | Admitting: Obstetrics

## 2016-02-15 ENCOUNTER — Encounter (HOSPITAL_COMMUNITY): Payer: Self-pay

## 2016-02-15 ENCOUNTER — Ambulatory Visit (HOSPITAL_COMMUNITY)
Admission: RE | Admit: 2016-02-15 | Discharge: 2016-02-15 | Disposition: A | Discharge status: Home / Self Care | Payer: Medicare Other | Source: Ambulatory Visit | Attending: Obstetrics | Admitting: Obstetrics

## 2016-02-15 DIAGNOSIS — M810 Age-related osteoporosis without current pathological fracture: Secondary | ICD-10-CM | POA: Insufficient documentation

## 2016-02-15 DIAGNOSIS — Z8711 Personal history of peptic ulcer disease: Secondary | ICD-10-CM | POA: Insufficient documentation

## 2016-02-15 HISTORY — DX: Gastro-esophageal reflux disease without esophagitis: K21.9

## 2016-02-15 HISTORY — DX: Headache, unspecified: R51.9

## 2016-02-15 HISTORY — DX: Headache: R51

## 2016-02-15 HISTORY — DX: Unspecified osteoarthritis, unspecified site: M19.90

## 2016-02-15 MED ORDER — ZOLEDRONIC ACID 5 MG/100ML IV SOLN
5.0000 mg | Freq: Once | INTRAVENOUS | Status: AC
  Administered 2016-02-15: 5 mg via INTRAVENOUS
  Filled 2016-02-15: qty 100

## 2016-02-15 MED ORDER — SODIUM CHLORIDE 0.9 % IV SOLN
INTRAVENOUS | Status: DC
  Administered 2016-02-15: 13:00:00 via INTRAVENOUS

## 2016-02-15 NOTE — Discharge Instructions (Signed)

## 2016-03-08 DIAGNOSIS — M79672 Pain in left foot: Secondary | ICD-10-CM | POA: Diagnosis not present

## 2016-03-08 DIAGNOSIS — M25572 Pain in left ankle and joints of left foot: Secondary | ICD-10-CM | POA: Diagnosis not present

## 2016-04-10 DIAGNOSIS — M79673 Pain in unspecified foot: Secondary | ICD-10-CM | POA: Diagnosis not present

## 2016-04-10 DIAGNOSIS — M25572 Pain in left ankle and joints of left foot: Secondary | ICD-10-CM | POA: Diagnosis not present

## 2016-05-01 DIAGNOSIS — H00023 Hordeolum internum right eye, unspecified eyelid: Secondary | ICD-10-CM | POA: Diagnosis not present

## 2016-05-02 DIAGNOSIS — M1990 Unspecified osteoarthritis, unspecified site: Secondary | ICD-10-CM | POA: Diagnosis not present

## 2016-05-02 DIAGNOSIS — E782 Mixed hyperlipidemia: Secondary | ICD-10-CM | POA: Diagnosis not present

## 2016-05-02 DIAGNOSIS — E559 Vitamin D deficiency, unspecified: Secondary | ICD-10-CM | POA: Diagnosis not present

## 2016-05-02 DIAGNOSIS — R002 Palpitations: Secondary | ICD-10-CM | POA: Diagnosis not present

## 2016-05-02 DIAGNOSIS — F419 Anxiety disorder, unspecified: Secondary | ICD-10-CM | POA: Diagnosis not present

## 2016-05-18 DIAGNOSIS — H25041 Posterior subcapsular polar age-related cataract, right eye: Secondary | ICD-10-CM | POA: Diagnosis not present

## 2016-05-18 DIAGNOSIS — H5203 Hypermetropia, bilateral: Secondary | ICD-10-CM | POA: Diagnosis not present

## 2016-05-18 DIAGNOSIS — H524 Presbyopia: Secondary | ICD-10-CM | POA: Diagnosis not present

## 2016-05-18 DIAGNOSIS — H52223 Regular astigmatism, bilateral: Secondary | ICD-10-CM | POA: Diagnosis not present

## 2016-06-06 DIAGNOSIS — Z1389 Encounter for screening for other disorder: Secondary | ICD-10-CM | POA: Diagnosis not present

## 2016-06-06 DIAGNOSIS — Z0001 Encounter for general adult medical examination with abnormal findings: Secondary | ICD-10-CM | POA: Diagnosis not present

## 2016-06-06 DIAGNOSIS — E782 Mixed hyperlipidemia: Secondary | ICD-10-CM | POA: Diagnosis not present

## 2016-06-06 DIAGNOSIS — Z23 Encounter for immunization: Secondary | ICD-10-CM | POA: Diagnosis not present

## 2016-06-06 DIAGNOSIS — Z6822 Body mass index (BMI) 22.0-22.9, adult: Secondary | ICD-10-CM | POA: Diagnosis not present

## 2016-06-06 DIAGNOSIS — M8580 Other specified disorders of bone density and structure, unspecified site: Secondary | ICD-10-CM | POA: Diagnosis not present

## 2016-06-06 DIAGNOSIS — F419 Anxiety disorder, unspecified: Secondary | ICD-10-CM | POA: Diagnosis not present

## 2016-06-11 ENCOUNTER — Other Ambulatory Visit: Payer: Self-pay | Admitting: Orthopaedic Surgery

## 2016-06-11 DIAGNOSIS — M19079 Primary osteoarthritis, unspecified ankle and foot: Secondary | ICD-10-CM

## 2016-06-11 DIAGNOSIS — M25572 Pain in left ankle and joints of left foot: Secondary | ICD-10-CM

## 2016-06-13 DIAGNOSIS — S0502XA Injury of conjunctiva and corneal abrasion without foreign body, left eye, initial encounter: Secondary | ICD-10-CM | POA: Diagnosis not present

## 2016-06-19 DIAGNOSIS — D239 Other benign neoplasm of skin, unspecified: Secondary | ICD-10-CM | POA: Diagnosis not present

## 2016-06-19 DIAGNOSIS — L570 Actinic keratosis: Secondary | ICD-10-CM | POA: Diagnosis not present

## 2016-07-06 ENCOUNTER — Inpatient Hospital Stay: Admission: RE | Admit: 2016-07-06 | Payer: Medicare Other | Source: Ambulatory Visit

## 2016-07-12 DIAGNOSIS — M71342 Other bursal cyst, left hand: Secondary | ICD-10-CM | POA: Diagnosis not present

## 2016-07-12 DIAGNOSIS — S8265XA Nondisplaced fracture of lateral malleolus of left fibula, initial encounter for closed fracture: Secondary | ICD-10-CM | POA: Diagnosis not present

## 2016-07-12 DIAGNOSIS — M25572 Pain in left ankle and joints of left foot: Secondary | ICD-10-CM | POA: Diagnosis not present

## 2016-07-24 ENCOUNTER — Ambulatory Visit
Admission: RE | Admit: 2016-07-24 | Discharge: 2016-07-24 | Disposition: A | Discharge status: Home / Self Care | Payer: Medicare Other | Source: Ambulatory Visit | Attending: Orthopaedic Surgery | Admitting: Orthopaedic Surgery

## 2016-07-24 DIAGNOSIS — M25572 Pain in left ankle and joints of left foot: Secondary | ICD-10-CM

## 2016-07-24 DIAGNOSIS — M19079 Primary osteoarthritis, unspecified ankle and foot: Secondary | ICD-10-CM

## 2016-07-24 MED ORDER — IOPAMIDOL (ISOVUE-M 200) INJECTION 41%
1.0000 mL | Freq: Once | INTRAMUSCULAR | Status: AC
  Administered 2016-07-24: 1 mL via INTRA_ARTICULAR

## 2016-07-24 MED ORDER — METHYLPREDNISOLONE ACETATE 40 MG/ML INJ SUSP (RADIOLOG
120.0000 mg | Freq: Once | INTRAMUSCULAR | Status: AC
  Administered 2016-07-24: 120 mg via INTRA_ARTICULAR

## 2016-07-27 DIAGNOSIS — M71342 Other bursal cyst, left hand: Secondary | ICD-10-CM | POA: Diagnosis not present

## 2016-07-27 DIAGNOSIS — S8265XD Nondisplaced fracture of lateral malleolus of left fibula, subsequent encounter for closed fracture with routine healing: Secondary | ICD-10-CM | POA: Diagnosis not present

## 2016-08-13 DIAGNOSIS — Z6821 Body mass index (BMI) 21.0-21.9, adult: Secondary | ICD-10-CM | POA: Diagnosis not present

## 2016-08-13 DIAGNOSIS — R300 Dysuria: Secondary | ICD-10-CM | POA: Diagnosis not present

## 2016-08-13 DIAGNOSIS — S8265XD Nondisplaced fracture of lateral malleolus of left fibula, subsequent encounter for closed fracture with routine healing: Secondary | ICD-10-CM | POA: Diagnosis not present

## 2016-08-15 DIAGNOSIS — M25572 Pain in left ankle and joints of left foot: Secondary | ICD-10-CM | POA: Diagnosis not present

## 2016-08-15 DIAGNOSIS — S8263XA Displaced fracture of lateral malleolus of unspecified fibula, initial encounter for closed fracture: Secondary | ICD-10-CM | POA: Diagnosis not present

## 2016-09-04 DIAGNOSIS — M25572 Pain in left ankle and joints of left foot: Secondary | ICD-10-CM | POA: Diagnosis not present

## 2016-10-18 DIAGNOSIS — Z23 Encounter for immunization: Secondary | ICD-10-CM | POA: Diagnosis not present

## 2016-11-09 DIAGNOSIS — Z124 Encounter for screening for malignant neoplasm of cervix: Secondary | ICD-10-CM | POA: Diagnosis not present

## 2016-11-09 DIAGNOSIS — M810 Age-related osteoporosis without current pathological fracture: Secondary | ICD-10-CM | POA: Diagnosis not present

## 2016-12-03 HISTORY — PX: COLON SURGERY: SHX602

## 2016-12-04 DIAGNOSIS — E782 Mixed hyperlipidemia: Secondary | ICD-10-CM | POA: Diagnosis not present

## 2016-12-05 DIAGNOSIS — L570 Actinic keratosis: Secondary | ICD-10-CM | POA: Diagnosis not present

## 2016-12-07 DIAGNOSIS — S8412XS Injury of peroneal nerve at lower leg level, left leg, sequela: Secondary | ICD-10-CM | POA: Diagnosis not present

## 2016-12-07 DIAGNOSIS — E782 Mixed hyperlipidemia: Secondary | ICD-10-CM | POA: Diagnosis not present

## 2016-12-07 DIAGNOSIS — Z6820 Body mass index (BMI) 20.0-20.9, adult: Secondary | ICD-10-CM | POA: Diagnosis not present

## 2016-12-07 DIAGNOSIS — M25572 Pain in left ankle and joints of left foot: Secondary | ICD-10-CM | POA: Diagnosis not present

## 2016-12-07 DIAGNOSIS — N3281 Overactive bladder: Secondary | ICD-10-CM | POA: Diagnosis not present

## 2016-12-07 DIAGNOSIS — F419 Anxiety disorder, unspecified: Secondary | ICD-10-CM | POA: Diagnosis not present

## 2016-12-26 DIAGNOSIS — M810 Age-related osteoporosis without current pathological fracture: Secondary | ICD-10-CM | POA: Diagnosis not present

## 2016-12-26 DIAGNOSIS — M859 Disorder of bone density and structure, unspecified: Secondary | ICD-10-CM | POA: Diagnosis not present

## 2016-12-26 DIAGNOSIS — M8589 Other specified disorders of bone density and structure, multiple sites: Secondary | ICD-10-CM | POA: Diagnosis not present

## 2016-12-26 DIAGNOSIS — Z13820 Encounter for screening for osteoporosis: Secondary | ICD-10-CM | POA: Diagnosis not present

## 2016-12-26 DIAGNOSIS — N959 Unspecified menopausal and perimenopausal disorder: Secondary | ICD-10-CM | POA: Diagnosis not present

## 2017-01-01 DIAGNOSIS — Z1231 Encounter for screening mammogram for malignant neoplasm of breast: Secondary | ICD-10-CM | POA: Diagnosis not present

## 2017-01-01 DIAGNOSIS — M810 Age-related osteoporosis without current pathological fracture: Secondary | ICD-10-CM | POA: Diagnosis not present

## 2017-01-08 DIAGNOSIS — L570 Actinic keratosis: Secondary | ICD-10-CM | POA: Diagnosis not present

## 2017-01-19 DIAGNOSIS — Z6821 Body mass index (BMI) 21.0-21.9, adult: Secondary | ICD-10-CM | POA: Diagnosis not present

## 2017-01-19 DIAGNOSIS — M461 Sacroiliitis, not elsewhere classified: Secondary | ICD-10-CM | POA: Diagnosis not present

## 2017-02-05 DIAGNOSIS — M810 Age-related osteoporosis without current pathological fracture: Secondary | ICD-10-CM | POA: Diagnosis not present

## 2017-02-12 ENCOUNTER — Other Ambulatory Visit: Payer: Self-pay | Admitting: Obstetrics

## 2017-02-12 ENCOUNTER — Other Ambulatory Visit: Payer: Self-pay | Admitting: Obstetrics and Gynecology

## 2017-02-15 ENCOUNTER — Encounter (HOSPITAL_COMMUNITY): Payer: Self-pay

## 2017-02-15 ENCOUNTER — Ambulatory Visit (HOSPITAL_COMMUNITY)
Admission: RE | Admit: 2017-02-15 | Discharge: 2017-02-15 | Disposition: A | Discharge status: Home / Self Care | Payer: Medicare Other | Source: Ambulatory Visit | Attending: Obstetrics | Admitting: Obstetrics

## 2017-02-15 DIAGNOSIS — K130 Diseases of lips: Secondary | ICD-10-CM | POA: Diagnosis not present

## 2017-02-15 DIAGNOSIS — M810 Age-related osteoporosis without current pathological fracture: Secondary | ICD-10-CM | POA: Diagnosis not present

## 2017-02-15 DIAGNOSIS — L570 Actinic keratosis: Secondary | ICD-10-CM | POA: Diagnosis not present

## 2017-02-15 MED ORDER — SODIUM CHLORIDE 0.9 % IV SOLN
Freq: Once | INTRAVENOUS | Status: AC
  Administered 2017-02-15: 12:00:00 via INTRAVENOUS

## 2017-02-15 MED ORDER — ZOLEDRONIC ACID 5 MG/100ML IV SOLN
5.0000 mg | Freq: Once | INTRAVENOUS | Status: AC
  Administered 2017-02-15: 5 mg via INTRAVENOUS
  Filled 2017-02-15: qty 100

## 2017-02-15 NOTE — Discharge Instructions (Signed)
Zoledronic Acid injection (Paget's Disease, Osteoporosis)/Reclast Infusion  °What is this medicine? °ZOLEDRONIC ACID (ZOE le dron ik AS id) lowers the amount of calcium loss from bone. It is used to treat Paget's disease and osteoporosis in women. °This medicine may be used for other purposes; ask your health care provider or pharmacist if you have questions. °COMMON BRAND NAME(S): Reclast, Zometa °What should I tell my health care provider before I take this medicine? °They need to know if you have any of these conditions: °-aspirin-sensitive asthma °-cancer, especially if you are receiving medicines used to treat cancer °-dental disease or wear dentures °-infection °-kidney disease °-low levels of calcium in the blood °-past surgery on the parathyroid gland or intestines °-receiving corticosteroids like dexamethasone or prednisone °-an unusual or allergic reaction to zoledronic acid, other medicines, foods, dyes, or preservatives °-pregnant or trying to get pregnant °-breast-feeding °How should I use this medicine? °This medicine is for infusion into a vein. It is given by a health care professional in a hospital or clinic setting. °Talk to your pediatrician regarding the use of this medicine in children. This medicine is not approved for use in children. °Overdosage: If you think you have taken too much of this medicine contact a poison control center or emergency room at once. °NOTE: This medicine is only for you. Do not share this medicine with others. °What if I miss a dose? °It is important not to miss your dose. Call your doctor or health care professional if you are unable to keep an appointment. °What may interact with this medicine? °-certain antibiotics given by injection °-NSAIDs, medicines for pain and inflammation, like ibuprofen or naproxen °-some diuretics like bumetanide, furosemide °-teriparatide °This list may not describe all possible interactions. Give your health care provider a list of all  the medicines, herbs, non-prescription drugs, or dietary supplements you use. Also tell them if you smoke, drink alcohol, or use illegal drugs. Some items may interact with your medicine. °What should I watch for while using this medicine? °Visit your doctor or health care professional for regular checkups. It may be some time before you see the benefit from this medicine. Do not stop taking your medicine unless your doctor tells you to. Your doctor may order blood tests or other tests to see how you are doing. °Women should inform their doctor if they wish to become pregnant or think they might be pregnant. There is a potential for serious side effects to an unborn child. Talk to your health care professional or pharmacist for more information. °You should make sure that you get enough calcium and vitamin D while you are taking this medicine. Discuss the foods you eat and the vitamins you take with your health care professional. °Some people who take this medicine have severe bone, joint, and/or muscle pain. This medicine may also increase your risk for jaw problems or a broken thigh bone. Tell your doctor right away if you have severe pain in your jaw, bones, joints, or muscles. Tell your doctor if you have any pain that does not go away or that gets worse. °Tell your dentist and dental surgeon that you are taking this medicine. You should not have major dental surgery while on this medicine. See your dentist to have a dental exam and fix any dental problems before starting this medicine. Take good care of your teeth while on this medicine. Make sure you see your dentist for regular follow-up appointments. °What side effects may I notice from receiving   this medicine? °Side effects that you should report to your doctor or health care professional as soon as possible: °-allergic reactions like skin rash, itching or hives, swelling of the face, lips, or tongue °-anxiety, confusion, or depression °-breathing  problems °-changes in vision °-eye pain °-feeling faint or lightheaded, falls °-jaw pain, especially after dental work °-mouth sores °-muscle cramps, stiffness, or weakness °-redness, blistering, peeling or loosening of the skin, including inside the mouth °-trouble passing urine or change in the amount of urine °Side effects that usually do not require medical attention (report to your doctor or health care professional if they continue or are bothersome): °-bone, joint, or muscle pain °-constipation °-diarrhea °-fever °-hair loss °-irritation at site where injected °-loss of appetite °-nausea, vomiting °-stomach upset °-trouble sleeping °-trouble swallowing °-weak or tired °This list may not describe all possible side effects. Call your doctor for medical advice about side effects. You may report side effects to FDA at 1-800-FDA-1088. °Where should I keep my medicine? °This drug is given in a hospital or clinic and will not be stored at home. °NOTE: This sheet is a summary. It may not cover all possible information. If you have questions about this medicine, talk to your doctor, pharmacist, or health care provider. °© 2018 [employer]/Gold Standard (2014-04-17 14:19:57) ° °

## 2017-02-22 DIAGNOSIS — M5106 Intervertebral disc disorders with myelopathy, lumbar region: Secondary | ICD-10-CM | POA: Diagnosis not present

## 2017-02-22 DIAGNOSIS — M791 Myalgia: Secondary | ICD-10-CM | POA: Diagnosis not present

## 2017-02-22 DIAGNOSIS — M549 Dorsalgia, unspecified: Secondary | ICD-10-CM | POA: Diagnosis not present

## 2017-02-22 DIAGNOSIS — M4716 Other spondylosis with myelopathy, lumbar region: Secondary | ICD-10-CM | POA: Diagnosis not present

## 2017-02-22 DIAGNOSIS — M5416 Radiculopathy, lumbar region: Secondary | ICD-10-CM | POA: Diagnosis not present

## 2017-02-26 ENCOUNTER — Other Ambulatory Visit: Payer: Self-pay | Admitting: Orthopaedic Surgery

## 2017-02-26 DIAGNOSIS — M4716 Other spondylosis with myelopathy, lumbar region: Secondary | ICD-10-CM

## 2017-03-01 ENCOUNTER — Ambulatory Visit (HOSPITAL_COMMUNITY): Payer: Medicare Other

## 2017-03-07 ENCOUNTER — Inpatient Hospital Stay
Admission: RE | Admit: 2017-03-07 | Discharge: 2017-03-07 | Disposition: A | Discharge status: Home / Self Care | Payer: Medicare Other | Source: Ambulatory Visit | Attending: Orthopaedic Surgery | Admitting: Orthopaedic Surgery

## 2017-03-17 ENCOUNTER — Ambulatory Visit
Admission: RE | Admit: 2017-03-17 | Discharge: 2017-03-17 | Disposition: A | Discharge status: Home / Self Care | Payer: Medicare Other | Source: Ambulatory Visit | Attending: Orthopaedic Surgery | Admitting: Orthopaedic Surgery

## 2017-03-17 DIAGNOSIS — M4716 Other spondylosis with myelopathy, lumbar region: Secondary | ICD-10-CM

## 2017-03-17 DIAGNOSIS — M48061 Spinal stenosis, lumbar region without neurogenic claudication: Secondary | ICD-10-CM | POA: Diagnosis not present

## 2017-04-11 DIAGNOSIS — Z6823 Body mass index (BMI) 23.0-23.9, adult: Secondary | ICD-10-CM | POA: Diagnosis not present

## 2017-04-11 DIAGNOSIS — M5126 Other intervertebral disc displacement, lumbar region: Secondary | ICD-10-CM | POA: Diagnosis not present

## 2017-04-11 DIAGNOSIS — M5432 Sciatica, left side: Secondary | ICD-10-CM | POA: Diagnosis not present

## 2017-05-07 DIAGNOSIS — M5432 Sciatica, left side: Secondary | ICD-10-CM | POA: Diagnosis not present

## 2017-05-20 DIAGNOSIS — M545 Low back pain: Secondary | ICD-10-CM | POA: Diagnosis not present

## 2017-05-20 DIAGNOSIS — Z6822 Body mass index (BMI) 22.0-22.9, adult: Secondary | ICD-10-CM | POA: Diagnosis not present

## 2017-05-20 DIAGNOSIS — E782 Mixed hyperlipidemia: Secondary | ICD-10-CM | POA: Diagnosis not present

## 2017-05-20 DIAGNOSIS — Z0001 Encounter for general adult medical examination with abnormal findings: Secondary | ICD-10-CM | POA: Diagnosis not present

## 2017-05-27 DIAGNOSIS — E559 Vitamin D deficiency, unspecified: Secondary | ICD-10-CM | POA: Diagnosis not present

## 2017-05-27 DIAGNOSIS — F419 Anxiety disorder, unspecified: Secondary | ICD-10-CM | POA: Diagnosis not present

## 2017-05-27 DIAGNOSIS — R002 Palpitations: Secondary | ICD-10-CM | POA: Diagnosis not present

## 2017-05-27 DIAGNOSIS — E782 Mixed hyperlipidemia: Secondary | ICD-10-CM | POA: Diagnosis not present

## 2017-05-27 DIAGNOSIS — R5383 Other fatigue: Secondary | ICD-10-CM | POA: Diagnosis not present

## 2017-05-28 DIAGNOSIS — G894 Chronic pain syndrome: Secondary | ICD-10-CM | POA: Diagnosis not present

## 2017-05-28 DIAGNOSIS — M5432 Sciatica, left side: Secondary | ICD-10-CM | POA: Diagnosis not present

## 2017-05-28 DIAGNOSIS — Z79891 Long term (current) use of opiate analgesic: Secondary | ICD-10-CM | POA: Diagnosis not present

## 2017-05-28 DIAGNOSIS — Z79899 Other long term (current) drug therapy: Secondary | ICD-10-CM | POA: Diagnosis not present

## 2017-06-18 DIAGNOSIS — M5126 Other intervertebral disc displacement, lumbar region: Secondary | ICD-10-CM | POA: Diagnosis not present

## 2017-06-18 DIAGNOSIS — M791 Myalgia: Secondary | ICD-10-CM | POA: Diagnosis not present

## 2017-06-18 DIAGNOSIS — M5432 Sciatica, left side: Secondary | ICD-10-CM | POA: Diagnosis not present

## 2017-06-18 DIAGNOSIS — Z6824 Body mass index (BMI) 24.0-24.9, adult: Secondary | ICD-10-CM | POA: Diagnosis not present

## 2017-07-02 DIAGNOSIS — M5432 Sciatica, left side: Secondary | ICD-10-CM | POA: Diagnosis not present

## 2017-07-09 DIAGNOSIS — J0110 Acute frontal sinusitis, unspecified: Secondary | ICD-10-CM | POA: Diagnosis not present

## 2017-07-09 DIAGNOSIS — Z6822 Body mass index (BMI) 22.0-22.9, adult: Secondary | ICD-10-CM | POA: Diagnosis not present

## 2017-07-11 DIAGNOSIS — J0110 Acute frontal sinusitis, unspecified: Secondary | ICD-10-CM | POA: Diagnosis not present

## 2017-07-20 DIAGNOSIS — J0110 Acute frontal sinusitis, unspecified: Secondary | ICD-10-CM | POA: Diagnosis not present

## 2017-07-20 DIAGNOSIS — Z6821 Body mass index (BMI) 21.0-21.9, adult: Secondary | ICD-10-CM | POA: Diagnosis not present

## 2017-07-31 DIAGNOSIS — M545 Low back pain: Secondary | ICD-10-CM | POA: Diagnosis not present

## 2017-07-31 DIAGNOSIS — M5416 Radiculopathy, lumbar region: Secondary | ICD-10-CM | POA: Diagnosis not present

## 2017-07-31 DIAGNOSIS — M5126 Other intervertebral disc displacement, lumbar region: Secondary | ICD-10-CM | POA: Diagnosis not present

## 2017-08-01 DIAGNOSIS — M47819 Spondylosis without myelopathy or radiculopathy, site unspecified: Secondary | ICD-10-CM | POA: Diagnosis not present

## 2017-08-01 DIAGNOSIS — M545 Low back pain: Secondary | ICD-10-CM | POA: Diagnosis not present

## 2017-08-12 DIAGNOSIS — H43813 Vitreous degeneration, bilateral: Secondary | ICD-10-CM | POA: Diagnosis not present

## 2017-08-12 DIAGNOSIS — H18413 Arcus senilis, bilateral: Secondary | ICD-10-CM | POA: Diagnosis not present

## 2017-08-12 DIAGNOSIS — M9904 Segmental and somatic dysfunction of sacral region: Secondary | ICD-10-CM | POA: Diagnosis not present

## 2017-08-12 DIAGNOSIS — M9905 Segmental and somatic dysfunction of pelvic region: Secondary | ICD-10-CM | POA: Diagnosis not present

## 2017-08-12 DIAGNOSIS — M9903 Segmental and somatic dysfunction of lumbar region: Secondary | ICD-10-CM | POA: Diagnosis not present

## 2017-08-12 DIAGNOSIS — H524 Presbyopia: Secondary | ICD-10-CM | POA: Diagnosis not present

## 2017-08-12 DIAGNOSIS — H5203 Hypermetropia, bilateral: Secondary | ICD-10-CM | POA: Diagnosis not present

## 2017-08-12 DIAGNOSIS — H25041 Posterior subcapsular polar age-related cataract, right eye: Secondary | ICD-10-CM | POA: Diagnosis not present

## 2017-08-12 DIAGNOSIS — M5432 Sciatica, left side: Secondary | ICD-10-CM | POA: Diagnosis not present

## 2017-08-12 DIAGNOSIS — H52223 Regular astigmatism, bilateral: Secondary | ICD-10-CM | POA: Diagnosis not present

## 2017-08-13 DIAGNOSIS — M9903 Segmental and somatic dysfunction of lumbar region: Secondary | ICD-10-CM | POA: Diagnosis not present

## 2017-08-13 DIAGNOSIS — M5432 Sciatica, left side: Secondary | ICD-10-CM | POA: Diagnosis not present

## 2017-08-13 DIAGNOSIS — M9905 Segmental and somatic dysfunction of pelvic region: Secondary | ICD-10-CM | POA: Diagnosis not present

## 2017-08-13 DIAGNOSIS — M9904 Segmental and somatic dysfunction of sacral region: Secondary | ICD-10-CM | POA: Diagnosis not present

## 2017-08-15 DIAGNOSIS — M5432 Sciatica, left side: Secondary | ICD-10-CM | POA: Diagnosis not present

## 2017-08-15 DIAGNOSIS — M9903 Segmental and somatic dysfunction of lumbar region: Secondary | ICD-10-CM | POA: Diagnosis not present

## 2017-08-15 DIAGNOSIS — M9905 Segmental and somatic dysfunction of pelvic region: Secondary | ICD-10-CM | POA: Diagnosis not present

## 2017-08-15 DIAGNOSIS — M9904 Segmental and somatic dysfunction of sacral region: Secondary | ICD-10-CM | POA: Diagnosis not present

## 2017-08-19 DIAGNOSIS — M47816 Spondylosis without myelopathy or radiculopathy, lumbar region: Secondary | ICD-10-CM | POA: Diagnosis not present

## 2017-08-19 DIAGNOSIS — M9904 Segmental and somatic dysfunction of sacral region: Secondary | ICD-10-CM | POA: Diagnosis not present

## 2017-08-19 DIAGNOSIS — M9905 Segmental and somatic dysfunction of pelvic region: Secondary | ICD-10-CM | POA: Diagnosis not present

## 2017-08-19 DIAGNOSIS — M5432 Sciatica, left side: Secondary | ICD-10-CM | POA: Diagnosis not present

## 2017-08-19 DIAGNOSIS — M9903 Segmental and somatic dysfunction of lumbar region: Secondary | ICD-10-CM | POA: Diagnosis not present

## 2017-08-20 DIAGNOSIS — M9903 Segmental and somatic dysfunction of lumbar region: Secondary | ICD-10-CM | POA: Diagnosis not present

## 2017-08-20 DIAGNOSIS — M5432 Sciatica, left side: Secondary | ICD-10-CM | POA: Diagnosis not present

## 2017-08-20 DIAGNOSIS — M9904 Segmental and somatic dysfunction of sacral region: Secondary | ICD-10-CM | POA: Diagnosis not present

## 2017-08-20 DIAGNOSIS — M9905 Segmental and somatic dysfunction of pelvic region: Secondary | ICD-10-CM | POA: Diagnosis not present

## 2017-08-22 DIAGNOSIS — M5432 Sciatica, left side: Secondary | ICD-10-CM | POA: Diagnosis not present

## 2017-08-22 DIAGNOSIS — M9904 Segmental and somatic dysfunction of sacral region: Secondary | ICD-10-CM | POA: Diagnosis not present

## 2017-08-22 DIAGNOSIS — M9903 Segmental and somatic dysfunction of lumbar region: Secondary | ICD-10-CM | POA: Diagnosis not present

## 2017-08-22 DIAGNOSIS — M9905 Segmental and somatic dysfunction of pelvic region: Secondary | ICD-10-CM | POA: Diagnosis not present

## 2017-08-26 DIAGNOSIS — M9903 Segmental and somatic dysfunction of lumbar region: Secondary | ICD-10-CM | POA: Diagnosis not present

## 2017-08-26 DIAGNOSIS — M9904 Segmental and somatic dysfunction of sacral region: Secondary | ICD-10-CM | POA: Diagnosis not present

## 2017-08-26 DIAGNOSIS — M9905 Segmental and somatic dysfunction of pelvic region: Secondary | ICD-10-CM | POA: Diagnosis not present

## 2017-08-26 DIAGNOSIS — M5432 Sciatica, left side: Secondary | ICD-10-CM | POA: Diagnosis not present

## 2017-08-27 DIAGNOSIS — M9904 Segmental and somatic dysfunction of sacral region: Secondary | ICD-10-CM | POA: Diagnosis not present

## 2017-08-27 DIAGNOSIS — M9903 Segmental and somatic dysfunction of lumbar region: Secondary | ICD-10-CM | POA: Diagnosis not present

## 2017-08-27 DIAGNOSIS — M9905 Segmental and somatic dysfunction of pelvic region: Secondary | ICD-10-CM | POA: Diagnosis not present

## 2017-08-27 DIAGNOSIS — M5432 Sciatica, left side: Secondary | ICD-10-CM | POA: Diagnosis not present

## 2017-08-28 DIAGNOSIS — Z87891 Personal history of nicotine dependence: Secondary | ICD-10-CM | POA: Diagnosis not present

## 2017-08-28 DIAGNOSIS — K5720 Diverticulitis of large intestine with perforation and abscess without bleeding: Secondary | ICD-10-CM | POA: Diagnosis not present

## 2017-08-28 DIAGNOSIS — K5700 Diverticulitis of small intestine with perforation and abscess without bleeding: Secondary | ICD-10-CM | POA: Diagnosis not present

## 2017-08-28 DIAGNOSIS — K5790 Diverticulosis of intestine, part unspecified, without perforation or abscess without bleeding: Secondary | ICD-10-CM | POA: Diagnosis not present

## 2017-08-28 DIAGNOSIS — R1110 Vomiting, unspecified: Secondary | ICD-10-CM | POA: Diagnosis not present

## 2017-08-28 DIAGNOSIS — G894 Chronic pain syndrome: Secondary | ICD-10-CM | POA: Diagnosis present

## 2017-08-28 DIAGNOSIS — E876 Hypokalemia: Secondary | ICD-10-CM | POA: Diagnosis not present

## 2017-08-28 DIAGNOSIS — K5780 Diverticulitis of intestine, part unspecified, with perforation and abscess without bleeding: Secondary | ICD-10-CM | POA: Diagnosis not present

## 2017-08-28 DIAGNOSIS — K5792 Diverticulitis of intestine, part unspecified, without perforation or abscess without bleeding: Secondary | ICD-10-CM | POA: Diagnosis not present

## 2017-08-28 DIAGNOSIS — Z79899 Other long term (current) drug therapy: Secondary | ICD-10-CM | POA: Diagnosis not present

## 2017-08-28 DIAGNOSIS — R509 Fever, unspecified: Secondary | ICD-10-CM | POA: Diagnosis not present

## 2017-08-28 DIAGNOSIS — Z6821 Body mass index (BMI) 21.0-21.9, adult: Secondary | ICD-10-CM | POA: Diagnosis not present

## 2017-08-28 DIAGNOSIS — R739 Hyperglycemia, unspecified: Secondary | ICD-10-CM | POA: Diagnosis not present

## 2017-08-28 DIAGNOSIS — K5732 Diverticulitis of large intestine without perforation or abscess without bleeding: Secondary | ICD-10-CM | POA: Diagnosis not present

## 2017-09-04 DIAGNOSIS — K5780 Diverticulitis of intestine, part unspecified, with perforation and abscess without bleeding: Secondary | ICD-10-CM | POA: Diagnosis not present

## 2017-09-04 DIAGNOSIS — R531 Weakness: Secondary | ICD-10-CM | POA: Diagnosis not present

## 2017-09-04 DIAGNOSIS — R5383 Other fatigue: Secondary | ICD-10-CM | POA: Diagnosis not present

## 2017-09-05 DIAGNOSIS — R5383 Other fatigue: Secondary | ICD-10-CM | POA: Diagnosis not present

## 2017-09-05 DIAGNOSIS — K5780 Diverticulitis of intestine, part unspecified, with perforation and abscess without bleeding: Secondary | ICD-10-CM | POA: Diagnosis not present

## 2017-09-05 DIAGNOSIS — R531 Weakness: Secondary | ICD-10-CM | POA: Diagnosis not present

## 2017-09-06 DIAGNOSIS — R5383 Other fatigue: Secondary | ICD-10-CM | POA: Diagnosis not present

## 2017-09-06 DIAGNOSIS — R531 Weakness: Secondary | ICD-10-CM | POA: Diagnosis not present

## 2017-09-06 DIAGNOSIS — K5780 Diverticulitis of intestine, part unspecified, with perforation and abscess without bleeding: Secondary | ICD-10-CM | POA: Diagnosis not present

## 2017-09-07 DIAGNOSIS — K5780 Diverticulitis of intestine, part unspecified, with perforation and abscess without bleeding: Secondary | ICD-10-CM | POA: Diagnosis not present

## 2017-09-07 DIAGNOSIS — R5383 Other fatigue: Secondary | ICD-10-CM | POA: Diagnosis not present

## 2017-09-07 DIAGNOSIS — R531 Weakness: Secondary | ICD-10-CM | POA: Diagnosis not present

## 2017-09-08 DIAGNOSIS — R531 Weakness: Secondary | ICD-10-CM | POA: Diagnosis not present

## 2017-09-08 DIAGNOSIS — K5780 Diverticulitis of intestine, part unspecified, with perforation and abscess without bleeding: Secondary | ICD-10-CM | POA: Diagnosis not present

## 2017-09-08 DIAGNOSIS — R5383 Other fatigue: Secondary | ICD-10-CM | POA: Diagnosis not present

## 2017-09-09 DIAGNOSIS — K5780 Diverticulitis of intestine, part unspecified, with perforation and abscess without bleeding: Secondary | ICD-10-CM | POA: Diagnosis not present

## 2017-09-09 DIAGNOSIS — R5383 Other fatigue: Secondary | ICD-10-CM | POA: Diagnosis not present

## 2017-09-09 DIAGNOSIS — R531 Weakness: Secondary | ICD-10-CM | POA: Diagnosis not present

## 2017-09-10 DIAGNOSIS — R5383 Other fatigue: Secondary | ICD-10-CM | POA: Diagnosis not present

## 2017-09-10 DIAGNOSIS — R531 Weakness: Secondary | ICD-10-CM | POA: Diagnosis not present

## 2017-09-10 DIAGNOSIS — K5780 Diverticulitis of intestine, part unspecified, with perforation and abscess without bleeding: Secondary | ICD-10-CM | POA: Diagnosis not present

## 2017-09-11 DIAGNOSIS — K5720 Diverticulitis of large intestine with perforation and abscess without bleeding: Secondary | ICD-10-CM | POA: Diagnosis not present

## 2017-09-25 DIAGNOSIS — M5126 Other intervertebral disc displacement, lumbar region: Secondary | ICD-10-CM | POA: Diagnosis not present

## 2017-09-25 DIAGNOSIS — M47816 Spondylosis without myelopathy or radiculopathy, lumbar region: Secondary | ICD-10-CM | POA: Diagnosis not present

## 2017-10-03 DIAGNOSIS — R109 Unspecified abdominal pain: Secondary | ICD-10-CM | POA: Diagnosis not present

## 2017-10-03 DIAGNOSIS — K5792 Diverticulitis of intestine, part unspecified, without perforation or abscess without bleeding: Secondary | ICD-10-CM | POA: Diagnosis not present

## 2017-10-03 DIAGNOSIS — I700 Atherosclerosis of aorta: Secondary | ICD-10-CM | POA: Diagnosis not present

## 2017-10-10 DIAGNOSIS — M5432 Sciatica, left side: Secondary | ICD-10-CM | POA: Diagnosis not present

## 2017-10-10 DIAGNOSIS — M9903 Segmental and somatic dysfunction of lumbar region: Secondary | ICD-10-CM | POA: Diagnosis not present

## 2017-10-10 DIAGNOSIS — M9905 Segmental and somatic dysfunction of pelvic region: Secondary | ICD-10-CM | POA: Diagnosis not present

## 2017-10-10 DIAGNOSIS — M9904 Segmental and somatic dysfunction of sacral region: Secondary | ICD-10-CM | POA: Diagnosis not present

## 2017-10-14 DIAGNOSIS — M9904 Segmental and somatic dysfunction of sacral region: Secondary | ICD-10-CM | POA: Diagnosis not present

## 2017-10-14 DIAGNOSIS — M9905 Segmental and somatic dysfunction of pelvic region: Secondary | ICD-10-CM | POA: Diagnosis not present

## 2017-10-14 DIAGNOSIS — M5432 Sciatica, left side: Secondary | ICD-10-CM | POA: Diagnosis not present

## 2017-10-14 DIAGNOSIS — M9903 Segmental and somatic dysfunction of lumbar region: Secondary | ICD-10-CM | POA: Diagnosis not present

## 2017-10-15 DIAGNOSIS — M9905 Segmental and somatic dysfunction of pelvic region: Secondary | ICD-10-CM | POA: Diagnosis not present

## 2017-10-15 DIAGNOSIS — M5432 Sciatica, left side: Secondary | ICD-10-CM | POA: Diagnosis not present

## 2017-10-15 DIAGNOSIS — M9903 Segmental and somatic dysfunction of lumbar region: Secondary | ICD-10-CM | POA: Diagnosis not present

## 2017-10-15 DIAGNOSIS — M9904 Segmental and somatic dysfunction of sacral region: Secondary | ICD-10-CM | POA: Diagnosis not present

## 2017-10-21 DIAGNOSIS — M9903 Segmental and somatic dysfunction of lumbar region: Secondary | ICD-10-CM | POA: Diagnosis not present

## 2017-10-21 DIAGNOSIS — M9904 Segmental and somatic dysfunction of sacral region: Secondary | ICD-10-CM | POA: Diagnosis not present

## 2017-10-21 DIAGNOSIS — Z23 Encounter for immunization: Secondary | ICD-10-CM | POA: Diagnosis not present

## 2017-10-21 DIAGNOSIS — M9905 Segmental and somatic dysfunction of pelvic region: Secondary | ICD-10-CM | POA: Diagnosis not present

## 2017-10-21 DIAGNOSIS — M5432 Sciatica, left side: Secondary | ICD-10-CM | POA: Diagnosis not present

## 2017-10-22 DIAGNOSIS — M47816 Spondylosis without myelopathy or radiculopathy, lumbar region: Secondary | ICD-10-CM | POA: Diagnosis not present

## 2017-10-28 DIAGNOSIS — M9905 Segmental and somatic dysfunction of pelvic region: Secondary | ICD-10-CM | POA: Diagnosis not present

## 2017-10-28 DIAGNOSIS — M9903 Segmental and somatic dysfunction of lumbar region: Secondary | ICD-10-CM | POA: Diagnosis not present

## 2017-10-28 DIAGNOSIS — M9904 Segmental and somatic dysfunction of sacral region: Secondary | ICD-10-CM | POA: Diagnosis not present

## 2017-10-28 DIAGNOSIS — M5432 Sciatica, left side: Secondary | ICD-10-CM | POA: Diagnosis not present

## 2017-10-29 DIAGNOSIS — M47816 Spondylosis without myelopathy or radiculopathy, lumbar region: Secondary | ICD-10-CM | POA: Diagnosis not present

## 2017-10-30 DIAGNOSIS — M9903 Segmental and somatic dysfunction of lumbar region: Secondary | ICD-10-CM | POA: Diagnosis not present

## 2017-10-30 DIAGNOSIS — M9905 Segmental and somatic dysfunction of pelvic region: Secondary | ICD-10-CM | POA: Diagnosis not present

## 2017-10-30 DIAGNOSIS — M9904 Segmental and somatic dysfunction of sacral region: Secondary | ICD-10-CM | POA: Diagnosis not present

## 2017-10-30 DIAGNOSIS — M5432 Sciatica, left side: Secondary | ICD-10-CM | POA: Diagnosis not present

## 2017-11-05 DIAGNOSIS — K5732 Diverticulitis of large intestine without perforation or abscess without bleeding: Secondary | ICD-10-CM | POA: Diagnosis not present

## 2017-11-05 DIAGNOSIS — K5730 Diverticulosis of large intestine without perforation or abscess without bleeding: Secondary | ICD-10-CM | POA: Diagnosis not present

## 2017-11-13 DIAGNOSIS — M9903 Segmental and somatic dysfunction of lumbar region: Secondary | ICD-10-CM | POA: Diagnosis not present

## 2017-11-13 DIAGNOSIS — M9905 Segmental and somatic dysfunction of pelvic region: Secondary | ICD-10-CM | POA: Diagnosis not present

## 2017-11-13 DIAGNOSIS — M5432 Sciatica, left side: Secondary | ICD-10-CM | POA: Diagnosis not present

## 2017-11-13 DIAGNOSIS — M9904 Segmental and somatic dysfunction of sacral region: Secondary | ICD-10-CM | POA: Diagnosis not present

## 2017-11-14 DIAGNOSIS — M9905 Segmental and somatic dysfunction of pelvic region: Secondary | ICD-10-CM | POA: Diagnosis not present

## 2017-11-14 DIAGNOSIS — M5432 Sciatica, left side: Secondary | ICD-10-CM | POA: Diagnosis not present

## 2017-11-14 DIAGNOSIS — M9904 Segmental and somatic dysfunction of sacral region: Secondary | ICD-10-CM | POA: Diagnosis not present

## 2017-11-14 DIAGNOSIS — M9903 Segmental and somatic dysfunction of lumbar region: Secondary | ICD-10-CM | POA: Diagnosis not present

## 2017-11-18 DIAGNOSIS — R110 Nausea: Secondary | ICD-10-CM | POA: Diagnosis not present

## 2017-11-18 DIAGNOSIS — K5732 Diverticulitis of large intestine without perforation or abscess without bleeding: Secondary | ICD-10-CM | POA: Diagnosis not present

## 2017-11-19 DIAGNOSIS — M9905 Segmental and somatic dysfunction of pelvic region: Secondary | ICD-10-CM | POA: Diagnosis not present

## 2017-11-19 DIAGNOSIS — M9903 Segmental and somatic dysfunction of lumbar region: Secondary | ICD-10-CM | POA: Diagnosis not present

## 2017-11-19 DIAGNOSIS — M9904 Segmental and somatic dysfunction of sacral region: Secondary | ICD-10-CM | POA: Diagnosis not present

## 2017-11-19 DIAGNOSIS — M5432 Sciatica, left side: Secondary | ICD-10-CM | POA: Diagnosis not present

## 2017-11-28 DIAGNOSIS — M9903 Segmental and somatic dysfunction of lumbar region: Secondary | ICD-10-CM | POA: Diagnosis not present

## 2017-11-28 DIAGNOSIS — M5432 Sciatica, left side: Secondary | ICD-10-CM | POA: Diagnosis not present

## 2017-11-28 DIAGNOSIS — M9905 Segmental and somatic dysfunction of pelvic region: Secondary | ICD-10-CM | POA: Diagnosis not present

## 2017-11-28 DIAGNOSIS — M9904 Segmental and somatic dysfunction of sacral region: Secondary | ICD-10-CM | POA: Diagnosis not present

## 2017-12-03 HISTORY — PX: BACK SURGERY: SHX140

## 2017-12-05 DIAGNOSIS — M9905 Segmental and somatic dysfunction of pelvic region: Secondary | ICD-10-CM | POA: Diagnosis not present

## 2017-12-05 DIAGNOSIS — M9904 Segmental and somatic dysfunction of sacral region: Secondary | ICD-10-CM | POA: Diagnosis not present

## 2017-12-05 DIAGNOSIS — M5432 Sciatica, left side: Secondary | ICD-10-CM | POA: Diagnosis not present

## 2017-12-05 DIAGNOSIS — M9903 Segmental and somatic dysfunction of lumbar region: Secondary | ICD-10-CM | POA: Diagnosis not present

## 2017-12-17 DIAGNOSIS — M5127 Other intervertebral disc displacement, lumbosacral region: Secondary | ICD-10-CM | POA: Diagnosis not present

## 2017-12-17 DIAGNOSIS — M5417 Radiculopathy, lumbosacral region: Secondary | ICD-10-CM | POA: Diagnosis not present

## 2017-12-18 DIAGNOSIS — M5127 Other intervertebral disc displacement, lumbosacral region: Secondary | ICD-10-CM | POA: Diagnosis not present

## 2017-12-18 DIAGNOSIS — M5117 Intervertebral disc disorders with radiculopathy, lumbosacral region: Secondary | ICD-10-CM | POA: Diagnosis not present

## 2018-01-07 DIAGNOSIS — M810 Age-related osteoporosis without current pathological fracture: Secondary | ICD-10-CM | POA: Diagnosis not present

## 2018-01-07 DIAGNOSIS — Z1231 Encounter for screening mammogram for malignant neoplasm of breast: Secondary | ICD-10-CM | POA: Diagnosis not present

## 2018-01-07 DIAGNOSIS — Z01419 Encounter for gynecological examination (general) (routine) without abnormal findings: Secondary | ICD-10-CM | POA: Diagnosis not present

## 2018-01-07 DIAGNOSIS — Z124 Encounter for screening for malignant neoplasm of cervix: Secondary | ICD-10-CM | POA: Diagnosis not present

## 2018-01-14 DIAGNOSIS — Z6822 Body mass index (BMI) 22.0-22.9, adult: Secondary | ICD-10-CM | POA: Diagnosis not present

## 2018-01-14 DIAGNOSIS — R300 Dysuria: Secondary | ICD-10-CM | POA: Diagnosis not present

## 2018-01-20 DIAGNOSIS — R300 Dysuria: Secondary | ICD-10-CM | POA: Diagnosis not present

## 2018-01-20 DIAGNOSIS — G4733 Obstructive sleep apnea (adult) (pediatric): Secondary | ICD-10-CM | POA: Diagnosis not present

## 2018-01-20 DIAGNOSIS — Z01818 Encounter for other preprocedural examination: Secondary | ICD-10-CM | POA: Diagnosis not present

## 2018-01-20 DIAGNOSIS — R42 Dizziness and giddiness: Secondary | ICD-10-CM | POA: Diagnosis not present

## 2018-01-20 DIAGNOSIS — K5720 Diverticulitis of large intestine with perforation and abscess without bleeding: Secondary | ICD-10-CM | POA: Diagnosis not present

## 2018-01-20 DIAGNOSIS — K219 Gastro-esophageal reflux disease without esophagitis: Secondary | ICD-10-CM | POA: Diagnosis not present

## 2018-01-20 DIAGNOSIS — R7989 Other specified abnormal findings of blood chemistry: Secondary | ICD-10-CM | POA: Diagnosis not present

## 2018-01-20 DIAGNOSIS — N179 Acute kidney failure, unspecified: Secondary | ICD-10-CM | POA: Diagnosis not present

## 2018-01-27 DIAGNOSIS — N179 Acute kidney failure, unspecified: Secondary | ICD-10-CM | POA: Diagnosis not present

## 2018-01-29 DIAGNOSIS — K5732 Diverticulitis of large intestine without perforation or abscess without bleeding: Secondary | ICD-10-CM | POA: Diagnosis not present

## 2018-01-29 DIAGNOSIS — K5720 Diverticulitis of large intestine with perforation and abscess without bleeding: Secondary | ICD-10-CM | POA: Diagnosis not present

## 2018-01-29 DIAGNOSIS — R1032 Left lower quadrant pain: Secondary | ICD-10-CM | POA: Diagnosis not present

## 2018-02-06 DIAGNOSIS — R109 Unspecified abdominal pain: Secondary | ICD-10-CM | POA: Diagnosis not present

## 2018-02-06 DIAGNOSIS — K5720 Diverticulitis of large intestine with perforation and abscess without bleeding: Secondary | ICD-10-CM | POA: Diagnosis not present

## 2018-02-06 DIAGNOSIS — G8918 Other acute postprocedural pain: Secondary | ICD-10-CM | POA: Diagnosis not present

## 2018-02-24 DIAGNOSIS — Z9889 Other specified postprocedural states: Secondary | ICD-10-CM | POA: Diagnosis not present

## 2018-02-24 DIAGNOSIS — Z4889 Encounter for other specified surgical aftercare: Secondary | ICD-10-CM | POA: Diagnosis not present

## 2018-02-24 DIAGNOSIS — R110 Nausea: Secondary | ICD-10-CM | POA: Diagnosis not present

## 2018-03-24 DIAGNOSIS — Z4889 Encounter for other specified surgical aftercare: Secondary | ICD-10-CM | POA: Diagnosis not present

## 2018-04-29 DIAGNOSIS — M545 Low back pain: Secondary | ICD-10-CM | POA: Diagnosis not present

## 2018-05-01 DIAGNOSIS — N179 Acute kidney failure, unspecified: Secondary | ICD-10-CM | POA: Diagnosis not present

## 2018-05-06 DIAGNOSIS — M545 Low back pain: Secondary | ICD-10-CM | POA: Diagnosis not present

## 2018-05-06 DIAGNOSIS — M5126 Other intervertebral disc displacement, lumbar region: Secondary | ICD-10-CM | POA: Diagnosis not present

## 2018-05-21 DIAGNOSIS — M47816 Spondylosis without myelopathy or radiculopathy, lumbar region: Secondary | ICD-10-CM | POA: Diagnosis not present

## 2018-05-21 DIAGNOSIS — M47817 Spondylosis without myelopathy or radiculopathy, lumbosacral region: Secondary | ICD-10-CM | POA: Diagnosis not present

## 2018-06-17 DIAGNOSIS — M47816 Spondylosis without myelopathy or radiculopathy, lumbar region: Secondary | ICD-10-CM | POA: Diagnosis not present

## 2018-06-17 DIAGNOSIS — M545 Low back pain: Secondary | ICD-10-CM | POA: Diagnosis not present

## 2018-06-25 DIAGNOSIS — M47816 Spondylosis without myelopathy or radiculopathy, lumbar region: Secondary | ICD-10-CM | POA: Diagnosis not present

## 2018-07-02 DIAGNOSIS — M47816 Spondylosis without myelopathy or radiculopathy, lumbar region: Secondary | ICD-10-CM | POA: Diagnosis not present

## 2018-07-12 ENCOUNTER — Other Ambulatory Visit: Payer: Self-pay | Admitting: Rehabilitation

## 2018-07-12 DIAGNOSIS — M4848XA Fatigue fracture of vertebra, sacral and sacrococcygeal region, initial encounter for fracture: Secondary | ICD-10-CM

## 2018-07-14 DIAGNOSIS — M461 Sacroiliitis, not elsewhere classified: Secondary | ICD-10-CM | POA: Diagnosis not present

## 2018-07-22 ENCOUNTER — Ambulatory Visit
Admission: RE | Admit: 2018-07-22 | Discharge: 2018-07-22 | Disposition: A | Discharge status: Home / Self Care | Payer: Medicare Other | Source: Ambulatory Visit | Attending: Rehabilitation | Admitting: Rehabilitation

## 2018-07-22 DIAGNOSIS — M4848XA Fatigue fracture of vertebra, sacral and sacrococcygeal region, initial encounter for fracture: Secondary | ICD-10-CM

## 2018-07-22 DIAGNOSIS — M545 Low back pain: Secondary | ICD-10-CM | POA: Diagnosis not present

## 2018-07-30 DIAGNOSIS — M47816 Spondylosis without myelopathy or radiculopathy, lumbar region: Secondary | ICD-10-CM | POA: Diagnosis not present

## 2018-08-26 DIAGNOSIS — F321 Major depressive disorder, single episode, moderate: Secondary | ICD-10-CM | POA: Diagnosis not present

## 2018-09-02 DIAGNOSIS — F321 Major depressive disorder, single episode, moderate: Secondary | ICD-10-CM | POA: Diagnosis not present

## 2018-09-16 DIAGNOSIS — F321 Major depressive disorder, single episode, moderate: Secondary | ICD-10-CM | POA: Diagnosis not present

## 2018-09-17 DIAGNOSIS — M47816 Spondylosis without myelopathy or radiculopathy, lumbar region: Secondary | ICD-10-CM | POA: Diagnosis not present

## 2018-09-17 DIAGNOSIS — M47817 Spondylosis without myelopathy or radiculopathy, lumbosacral region: Secondary | ICD-10-CM | POA: Diagnosis not present

## 2018-10-03 DIAGNOSIS — F321 Major depressive disorder, single episode, moderate: Secondary | ICD-10-CM | POA: Diagnosis not present

## 2018-10-07 DIAGNOSIS — Z6823 Body mass index (BMI) 23.0-23.9, adult: Secondary | ICD-10-CM | POA: Diagnosis not present

## 2018-10-07 DIAGNOSIS — F411 Generalized anxiety disorder: Secondary | ICD-10-CM | POA: Diagnosis not present

## 2018-10-14 DIAGNOSIS — M5431 Sciatica, right side: Secondary | ICD-10-CM | POA: Diagnosis not present

## 2018-10-14 DIAGNOSIS — Z6823 Body mass index (BMI) 23.0-23.9, adult: Secondary | ICD-10-CM | POA: Diagnosis not present

## 2018-10-17 DIAGNOSIS — M810 Age-related osteoporosis without current pathological fracture: Secondary | ICD-10-CM | POA: Diagnosis not present

## 2018-10-17 DIAGNOSIS — L820 Inflamed seborrheic keratosis: Secondary | ICD-10-CM | POA: Diagnosis not present

## 2018-10-17 DIAGNOSIS — D229 Melanocytic nevi, unspecified: Secondary | ICD-10-CM | POA: Diagnosis not present

## 2018-10-17 DIAGNOSIS — F321 Major depressive disorder, single episode, moderate: Secondary | ICD-10-CM | POA: Diagnosis not present

## 2018-10-17 DIAGNOSIS — Z23 Encounter for immunization: Secondary | ICD-10-CM | POA: Diagnosis not present

## 2018-10-17 DIAGNOSIS — L570 Actinic keratosis: Secondary | ICD-10-CM | POA: Diagnosis not present

## 2018-10-21 DIAGNOSIS — H43813 Vitreous degeneration, bilateral: Secondary | ICD-10-CM | POA: Diagnosis not present

## 2018-10-21 DIAGNOSIS — H524 Presbyopia: Secondary | ICD-10-CM | POA: Diagnosis not present

## 2018-10-21 DIAGNOSIS — H18413 Arcus senilis, bilateral: Secondary | ICD-10-CM | POA: Diagnosis not present

## 2018-10-21 DIAGNOSIS — H52223 Regular astigmatism, bilateral: Secondary | ICD-10-CM | POA: Diagnosis not present

## 2018-10-21 DIAGNOSIS — H5203 Hypermetropia, bilateral: Secondary | ICD-10-CM | POA: Diagnosis not present

## 2018-10-21 DIAGNOSIS — H25041 Posterior subcapsular polar age-related cataract, right eye: Secondary | ICD-10-CM | POA: Diagnosis not present

## 2018-10-23 ENCOUNTER — Other Ambulatory Visit: Payer: Self-pay | Admitting: Obstetrics

## 2018-10-28 DIAGNOSIS — F321 Major depressive disorder, single episode, moderate: Secondary | ICD-10-CM | POA: Diagnosis not present

## 2018-11-03 DIAGNOSIS — M5432 Sciatica, left side: Secondary | ICD-10-CM | POA: Diagnosis not present

## 2018-11-03 DIAGNOSIS — M9903 Segmental and somatic dysfunction of lumbar region: Secondary | ICD-10-CM | POA: Diagnosis not present

## 2018-11-03 DIAGNOSIS — M9905 Segmental and somatic dysfunction of pelvic region: Secondary | ICD-10-CM | POA: Diagnosis not present

## 2018-11-03 DIAGNOSIS — M9904 Segmental and somatic dysfunction of sacral region: Secondary | ICD-10-CM | POA: Diagnosis not present

## 2018-11-05 DIAGNOSIS — Z6823 Body mass index (BMI) 23.0-23.9, adult: Secondary | ICD-10-CM | POA: Diagnosis not present

## 2018-11-05 DIAGNOSIS — F411 Generalized anxiety disorder: Secondary | ICD-10-CM | POA: Diagnosis not present

## 2018-11-06 DIAGNOSIS — M9903 Segmental and somatic dysfunction of lumbar region: Secondary | ICD-10-CM | POA: Diagnosis not present

## 2018-11-06 DIAGNOSIS — M9905 Segmental and somatic dysfunction of pelvic region: Secondary | ICD-10-CM | POA: Diagnosis not present

## 2018-11-06 DIAGNOSIS — M5432 Sciatica, left side: Secondary | ICD-10-CM | POA: Diagnosis not present

## 2018-11-06 DIAGNOSIS — M9904 Segmental and somatic dysfunction of sacral region: Secondary | ICD-10-CM | POA: Diagnosis not present

## 2018-11-10 ENCOUNTER — Ambulatory Visit (HOSPITAL_COMMUNITY): Payer: Medicare Other

## 2018-11-10 DIAGNOSIS — M9904 Segmental and somatic dysfunction of sacral region: Secondary | ICD-10-CM | POA: Diagnosis not present

## 2018-11-10 DIAGNOSIS — M5432 Sciatica, left side: Secondary | ICD-10-CM | POA: Diagnosis not present

## 2018-11-10 DIAGNOSIS — M9903 Segmental and somatic dysfunction of lumbar region: Secondary | ICD-10-CM | POA: Diagnosis not present

## 2018-11-10 DIAGNOSIS — M9905 Segmental and somatic dysfunction of pelvic region: Secondary | ICD-10-CM | POA: Diagnosis not present

## 2018-11-11 DIAGNOSIS — Z029 Encounter for administrative examinations, unspecified: Secondary | ICD-10-CM | POA: Diagnosis not present

## 2018-11-13 DIAGNOSIS — M9904 Segmental and somatic dysfunction of sacral region: Secondary | ICD-10-CM | POA: Diagnosis not present

## 2018-11-13 DIAGNOSIS — M9905 Segmental and somatic dysfunction of pelvic region: Secondary | ICD-10-CM | POA: Diagnosis not present

## 2018-11-13 DIAGNOSIS — M5432 Sciatica, left side: Secondary | ICD-10-CM | POA: Diagnosis not present

## 2018-11-13 DIAGNOSIS — M9903 Segmental and somatic dysfunction of lumbar region: Secondary | ICD-10-CM | POA: Diagnosis not present

## 2018-11-14 DIAGNOSIS — L820 Inflamed seborrheic keratosis: Secondary | ICD-10-CM | POA: Diagnosis not present

## 2018-11-14 DIAGNOSIS — D0439 Carcinoma in situ of skin of other parts of face: Secondary | ICD-10-CM | POA: Diagnosis not present

## 2018-11-14 DIAGNOSIS — D099 Carcinoma in situ, unspecified: Secondary | ICD-10-CM

## 2018-11-14 DIAGNOSIS — L570 Actinic keratosis: Secondary | ICD-10-CM | POA: Diagnosis not present

## 2018-11-14 DIAGNOSIS — D485 Neoplasm of uncertain behavior of skin: Secondary | ICD-10-CM | POA: Diagnosis not present

## 2018-11-14 HISTORY — DX: Carcinoma in situ, unspecified: D09.9

## 2018-11-17 DIAGNOSIS — M461 Sacroiliitis, not elsewhere classified: Secondary | ICD-10-CM | POA: Diagnosis not present

## 2018-11-18 DIAGNOSIS — M9905 Segmental and somatic dysfunction of pelvic region: Secondary | ICD-10-CM | POA: Diagnosis not present

## 2018-11-18 DIAGNOSIS — M5432 Sciatica, left side: Secondary | ICD-10-CM | POA: Diagnosis not present

## 2018-11-18 DIAGNOSIS — M9904 Segmental and somatic dysfunction of sacral region: Secondary | ICD-10-CM | POA: Diagnosis not present

## 2018-11-18 DIAGNOSIS — M9903 Segmental and somatic dysfunction of lumbar region: Secondary | ICD-10-CM | POA: Diagnosis not present

## 2018-11-20 DIAGNOSIS — F321 Major depressive disorder, single episode, moderate: Secondary | ICD-10-CM | POA: Diagnosis not present

## 2018-11-24 DIAGNOSIS — M5432 Sciatica, left side: Secondary | ICD-10-CM | POA: Diagnosis not present

## 2018-11-24 DIAGNOSIS — M9905 Segmental and somatic dysfunction of pelvic region: Secondary | ICD-10-CM | POA: Diagnosis not present

## 2018-11-24 DIAGNOSIS — M9904 Segmental and somatic dysfunction of sacral region: Secondary | ICD-10-CM | POA: Diagnosis not present

## 2018-11-24 DIAGNOSIS — M9903 Segmental and somatic dysfunction of lumbar region: Secondary | ICD-10-CM | POA: Diagnosis not present

## 2018-12-02 DIAGNOSIS — M9903 Segmental and somatic dysfunction of lumbar region: Secondary | ICD-10-CM | POA: Diagnosis not present

## 2018-12-02 DIAGNOSIS — M9904 Segmental and somatic dysfunction of sacral region: Secondary | ICD-10-CM | POA: Diagnosis not present

## 2018-12-02 DIAGNOSIS — M5432 Sciatica, left side: Secondary | ICD-10-CM | POA: Diagnosis not present

## 2018-12-02 DIAGNOSIS — M9905 Segmental and somatic dysfunction of pelvic region: Secondary | ICD-10-CM | POA: Diagnosis not present

## 2018-12-09 DIAGNOSIS — M9903 Segmental and somatic dysfunction of lumbar region: Secondary | ICD-10-CM | POA: Diagnosis not present

## 2018-12-09 DIAGNOSIS — M9904 Segmental and somatic dysfunction of sacral region: Secondary | ICD-10-CM | POA: Diagnosis not present

## 2018-12-09 DIAGNOSIS — M9905 Segmental and somatic dysfunction of pelvic region: Secondary | ICD-10-CM | POA: Diagnosis not present

## 2018-12-09 DIAGNOSIS — M5432 Sciatica, left side: Secondary | ICD-10-CM | POA: Diagnosis not present

## 2018-12-30 DIAGNOSIS — F321 Major depressive disorder, single episode, moderate: Secondary | ICD-10-CM | POA: Diagnosis not present

## 2019-01-01 DIAGNOSIS — D485 Neoplasm of uncertain behavior of skin: Secondary | ICD-10-CM | POA: Diagnosis not present

## 2019-01-01 DIAGNOSIS — D0439 Carcinoma in situ of skin of other parts of face: Secondary | ICD-10-CM | POA: Diagnosis not present

## 2019-01-01 DIAGNOSIS — L570 Actinic keratosis: Secondary | ICD-10-CM | POA: Diagnosis not present

## 2019-02-03 DIAGNOSIS — R030 Elevated blood-pressure reading, without diagnosis of hypertension: Secondary | ICD-10-CM | POA: Diagnosis not present

## 2019-02-03 DIAGNOSIS — M545 Low back pain: Secondary | ICD-10-CM | POA: Diagnosis not present

## 2019-02-10 DIAGNOSIS — Z124 Encounter for screening for malignant neoplasm of cervix: Secondary | ICD-10-CM | POA: Diagnosis not present

## 2019-02-10 DIAGNOSIS — Z01419 Encounter for gynecological examination (general) (routine) without abnormal findings: Secondary | ICD-10-CM | POA: Diagnosis not present

## 2019-02-10 DIAGNOSIS — Z1231 Encounter for screening mammogram for malignant neoplasm of breast: Secondary | ICD-10-CM | POA: Diagnosis not present

## 2019-02-10 DIAGNOSIS — F321 Major depressive disorder, single episode, moderate: Secondary | ICD-10-CM | POA: Diagnosis not present

## 2019-02-10 DIAGNOSIS — M810 Age-related osteoporosis without current pathological fracture: Secondary | ICD-10-CM | POA: Diagnosis not present

## 2019-02-13 ENCOUNTER — Other Ambulatory Visit: Payer: Self-pay | Admitting: Obstetrics

## 2019-02-13 DIAGNOSIS — M533 Sacrococcygeal disorders, not elsewhere classified: Secondary | ICD-10-CM | POA: Diagnosis not present

## 2019-02-19 DIAGNOSIS — H903 Sensorineural hearing loss, bilateral: Secondary | ICD-10-CM | POA: Diagnosis not present

## 2019-02-19 DIAGNOSIS — H9313 Tinnitus, bilateral: Secondary | ICD-10-CM | POA: Diagnosis not present

## 2019-02-19 DIAGNOSIS — L299 Pruritus, unspecified: Secondary | ICD-10-CM | POA: Diagnosis not present

## 2019-02-23 DIAGNOSIS — M533 Sacrococcygeal disorders, not elsewhere classified: Secondary | ICD-10-CM | POA: Diagnosis not present

## 2019-02-26 ENCOUNTER — Other Ambulatory Visit: Payer: Self-pay | Admitting: Obstetrics

## 2019-03-06 ENCOUNTER — Ambulatory Visit (HOSPITAL_COMMUNITY): Payer: Medicare Other

## 2019-05-05 DIAGNOSIS — M47818 Spondylosis without myelopathy or radiculopathy, sacral and sacrococcygeal region: Secondary | ICD-10-CM | POA: Diagnosis not present

## 2019-05-05 DIAGNOSIS — M47816 Spondylosis without myelopathy or radiculopathy, lumbar region: Secondary | ICD-10-CM | POA: Diagnosis not present

## 2019-05-12 DIAGNOSIS — M47816 Spondylosis without myelopathy or radiculopathy, lumbar region: Secondary | ICD-10-CM | POA: Diagnosis not present

## 2019-05-18 DIAGNOSIS — M47816 Spondylosis without myelopathy or radiculopathy, lumbar region: Secondary | ICD-10-CM | POA: Diagnosis not present

## 2019-05-19 DIAGNOSIS — D229 Melanocytic nevi, unspecified: Secondary | ICD-10-CM | POA: Diagnosis not present

## 2019-05-19 DIAGNOSIS — L821 Other seborrheic keratosis: Secondary | ICD-10-CM | POA: Diagnosis not present

## 2019-05-19 DIAGNOSIS — L570 Actinic keratosis: Secondary | ICD-10-CM | POA: Diagnosis not present

## 2019-06-03 DIAGNOSIS — M47818 Spondylosis without myelopathy or radiculopathy, sacral and sacrococcygeal region: Secondary | ICD-10-CM | POA: Diagnosis not present

## 2019-06-03 DIAGNOSIS — M47816 Spondylosis without myelopathy or radiculopathy, lumbar region: Secondary | ICD-10-CM | POA: Diagnosis not present

## 2019-07-03 ENCOUNTER — Other Ambulatory Visit: Payer: Self-pay

## 2019-07-10 ENCOUNTER — Other Ambulatory Visit: Payer: Self-pay | Admitting: Internal Medicine

## 2019-07-10 DIAGNOSIS — M47818 Spondylosis without myelopathy or radiculopathy, sacral and sacrococcygeal region: Secondary | ICD-10-CM | POA: Diagnosis not present

## 2019-07-10 DIAGNOSIS — Z20822 Contact with and (suspected) exposure to covid-19: Secondary | ICD-10-CM

## 2019-07-11 LAB — NOVEL CORONAVIRUS, NAA: SARS-CoV-2, NAA: NOT DETECTED

## 2019-07-22 ENCOUNTER — Telehealth: Payer: Self-pay | Admitting: General Practice

## 2019-07-22 NOTE — Telephone Encounter (Signed)
Pt received negative covid results. Pt states understanding

## 2019-09-18 DIAGNOSIS — M533 Sacrococcygeal disorders, not elsewhere classified: Secondary | ICD-10-CM | POA: Diagnosis not present

## 2019-09-29 DIAGNOSIS — M1812 Unilateral primary osteoarthritis of first carpometacarpal joint, left hand: Secondary | ICD-10-CM | POA: Diagnosis not present

## 2019-09-29 DIAGNOSIS — M1811 Unilateral primary osteoarthritis of first carpometacarpal joint, right hand: Secondary | ICD-10-CM | POA: Diagnosis not present

## 2019-10-08 DIAGNOSIS — M25551 Pain in right hip: Secondary | ICD-10-CM | POA: Diagnosis not present

## 2019-10-08 DIAGNOSIS — M810 Age-related osteoporosis without current pathological fracture: Secondary | ICD-10-CM | POA: Diagnosis not present

## 2019-10-08 DIAGNOSIS — M533 Sacrococcygeal disorders, not elsewhere classified: Secondary | ICD-10-CM | POA: Diagnosis not present

## 2019-10-09 ENCOUNTER — Other Ambulatory Visit: Payer: Self-pay | Admitting: Obstetrics

## 2019-10-16 ENCOUNTER — Ambulatory Visit (HOSPITAL_COMMUNITY)
Admission: RE | Admit: 2019-10-16 | Discharge: 2019-10-16 | Disposition: A | Discharge status: Home / Self Care | Payer: Medicare Other | Source: Ambulatory Visit | Attending: Obstetrics | Admitting: Obstetrics

## 2019-10-16 DIAGNOSIS — M810 Age-related osteoporosis without current pathological fracture: Secondary | ICD-10-CM | POA: Insufficient documentation

## 2019-10-16 MED ORDER — ZOLEDRONIC ACID 5 MG/100ML IV SOLN
INTRAVENOUS | Status: AC
  Filled 2019-10-16: qty 100

## 2019-10-16 MED ORDER — ZOLEDRONIC ACID 5 MG/100ML IV SOLN
5.0000 mg | Freq: Once | INTRAVENOUS | Status: AC
  Administered 2019-10-16: 5 mg via INTRAVENOUS

## 2019-10-20 ENCOUNTER — Other Ambulatory Visit: Payer: Self-pay | Admitting: Orthopedic Surgery

## 2019-10-20 DIAGNOSIS — M533 Sacrococcygeal disorders, not elsewhere classified: Secondary | ICD-10-CM | POA: Diagnosis not present

## 2019-11-10 ENCOUNTER — Other Ambulatory Visit: Payer: Self-pay

## 2019-11-10 DIAGNOSIS — Z20828 Contact with and (suspected) exposure to other viral communicable diseases: Secondary | ICD-10-CM | POA: Diagnosis not present

## 2019-11-10 DIAGNOSIS — Z20822 Contact with and (suspected) exposure to covid-19: Secondary | ICD-10-CM

## 2019-11-12 ENCOUNTER — Ambulatory Visit
Admission: RE | Admit: 2019-11-12 | Discharge: 2019-11-12 | Disposition: A | Discharge status: Home / Self Care | Payer: Medicare Other | Source: Ambulatory Visit | Attending: Orthopedic Surgery | Admitting: Orthopedic Surgery

## 2019-11-12 ENCOUNTER — Other Ambulatory Visit: Payer: Self-pay

## 2019-11-12 DIAGNOSIS — M533 Sacrococcygeal disorders, not elsewhere classified: Secondary | ICD-10-CM

## 2019-11-12 DIAGNOSIS — M1811 Unilateral primary osteoarthritis of first carpometacarpal joint, right hand: Secondary | ICD-10-CM | POA: Diagnosis not present

## 2019-11-12 DIAGNOSIS — M1812 Unilateral primary osteoarthritis of first carpometacarpal joint, left hand: Secondary | ICD-10-CM | POA: Diagnosis not present

## 2019-11-12 LAB — NOVEL CORONAVIRUS, NAA: SARS-CoV-2, NAA: NOT DETECTED

## 2019-12-02 ENCOUNTER — Other Ambulatory Visit: Payer: Self-pay | Admitting: Orthopedic Surgery

## 2019-12-18 NOTE — Progress Notes (Signed)
[place], [place] [id number] [place] [id number] Phone: [phone] Fax: [phone]      Your procedure is scheduled on January 20  Report to [facility] Main Entrance "A" at 1030 A.M., and check in at the Admitting office.  Call this number if you have problems the morning of surgery:  [phone]  Call [phone] if you have any questions prior to your surgery date Monday-Friday 8am-4pm    Remember:  Do not eat  after midnight the night before your surgery  You may drink clear liquids until 0930 the morning of your surgery.   Clear liquids allowed are: Water, Non-Citrus Juices (without pulp), Carbonated Beverages, Clear Tea, Black Coffee Only, and Gatorade    Take these medicines the morning of surgery with A SIP OF WATER  cetirizine (ZYRTEC) HYDROcodone-acetaminophen (NORCO/VICODIN) tiZANidine (ZANAFLEX)   7 days prior to surgery STOP taking any Aspirin (unless otherwise instructed by your surgeon), Aleve, Naproxen, Ibuprofen, Motrin, Advil, Goody's, BC's, all herbal medications, fish oil, and all vitamins.    The Morning of Surgery  Do not wear jewelry, make-up or nail polish.  Do not wear lotions, powders, or perfumes/colognes, or deodorant  Do not shave 48 hours prior to surgery.  Men may shave face and neck.  Do not bring valuables to the hospital.  [facility] is not responsible for any belongings or valuables.  If you are a smoker, DO NOT Smoke 24 hours prior to surgery  If you wear a CPAP at night please bring your mask the morning of surgery   Remember that you must have someone to transport you home after your surgery, and remain with you for 24 hours if you are discharged the same day.   Please bring cases for contacts, glasses, hearing aids, dentures or bridgework because it cannot be worn into surgery.    Leave your suitcase in the car.  After surgery it may be brought to your room.  For patients admitted  to the hospital, discharge time will be determined by your treatment team.  Patients discharged the day of surgery will not be allowed to drive home.    Special instructions:   [place]- Preparing For Surgery  Before surgery, you can play an important role. Because skin is not sterile, your skin needs to be as free of germs as possible. You can reduce the number of germs on your skin by washing with CHG (chlorahexidine gluconate) Soap before surgery.  CHG is an antiseptic cleaner which kills germs and bonds with the skin to continue killing germs even after washing.    Oral Hygiene is also important to reduce your risk of infection.  Remember - BRUSH YOUR TEETH THE MORNING OF SURGERY WITH YOUR REGULAR TOOTHPASTE  Please do not use if you have an allergy to CHG or antibacterial soaps. If your skin becomes reddened/irritated stop using the CHG.  Do not shave (including legs and underarms) for at least 48 hours prior to first CHG shower. It is OK to shave your face.  Please follow these instructions carefully.   1. Shower the NIGHT BEFORE SURGERY and the MORNING OF SURGERY with CHG Soap.   2. If you chose to wash your hair, wash your hair first as usual with your normal shampoo.  3. After you shampoo, rinse your hair and body thoroughly to remove the shampoo.  4. Use CHG as you would any other liquid soap. You can apply  CHG directly to the skin and wash gently with a scrungie or a clean washcloth.   5. Apply the CHG Soap to your body ONLY FROM THE NECK DOWN.  Do not use on open wounds or open sores. Avoid contact with your eyes, ears, mouth and genitals (private parts). Wash Face and genitals (private parts)  with your normal soap.   6. Wash thoroughly, paying special attention to the area where your surgery will be performed.  7. Thoroughly rinse your body with warm water from the neck down.  8. DO NOT shower/wash with your normal soap after using and rinsing off the CHG  Soap.  9. Pat yourself dry with a CLEAN TOWEL.  10. Wear CLEAN PAJAMAS to bed the night before surgery, wear comfortable clothes the morning of surgery  11. Place CLEAN SHEETS on your bed the night of your first shower and DO NOT SLEEP WITH PETS.    Day of Surgery:  Please shower the morning of surgery with the CHG soap Do not apply any deodorants/lotions. Please wear clean clothes to the hospital/surgery center.   Remember to brush your teeth WITH YOUR REGULAR TOOTHPASTE.   Please read over the following fact sheets that you were given.

## 2019-12-21 ENCOUNTER — Other Ambulatory Visit (HOSPITAL_COMMUNITY): Payer: Medicare Other

## 2019-12-21 ENCOUNTER — Encounter (HOSPITAL_COMMUNITY): Payer: Self-pay

## 2019-12-21 ENCOUNTER — Other Ambulatory Visit: Payer: Self-pay

## 2019-12-21 ENCOUNTER — Other Ambulatory Visit (HOSPITAL_COMMUNITY)
Admission: RE | Admit: 2019-12-21 | Discharge: 2019-12-21 | Disposition: A | Discharge status: Home / Self Care | Payer: Medicare Other | Source: Ambulatory Visit | Attending: Orthopedic Surgery | Admitting: Orthopedic Surgery

## 2019-12-21 ENCOUNTER — Encounter (HOSPITAL_COMMUNITY)
Admission: RE | Admit: 2019-12-21 | Discharge: 2019-12-21 | Disposition: A | Discharge status: Home / Self Care | Payer: Medicare Other | Source: Ambulatory Visit | Attending: Orthopedic Surgery | Admitting: Orthopedic Surgery

## 2019-12-21 DIAGNOSIS — Z20822 Contact with and (suspected) exposure to covid-19: Secondary | ICD-10-CM | POA: Insufficient documentation

## 2019-12-21 DIAGNOSIS — Z01812 Encounter for preprocedural laboratory examination: Secondary | ICD-10-CM | POA: Insufficient documentation

## 2019-12-21 HISTORY — DX: Other specified postprocedural states: Z98.890

## 2019-12-21 HISTORY — DX: Anxiety disorder, unspecified: F41.9

## 2019-12-21 HISTORY — DX: Nausea with vomiting, unspecified: R11.2

## 2019-12-21 HISTORY — DX: Depression, unspecified: F32.A

## 2019-12-21 LAB — SURGICAL PCR SCREEN
MRSA, PCR: NEGATIVE
Staphylococcus aureus: NEGATIVE

## 2019-12-21 LAB — CBC WITH DIFFERENTIAL/PLATELET
Abs Immature Granulocytes: 0.02 10*3/uL (ref 0.00–0.07)
Basophils Absolute: 0.1 10*3/uL (ref 0.0–0.1)
Basophils Relative: 1 %
Eosinophils Absolute: 0.1 10*3/uL (ref 0.0–0.5)
Eosinophils Relative: 1 %
HCT: 43 % (ref 36.0–46.0)
Hemoglobin: 13.9 g/dL (ref 12.0–15.0)
Immature Granulocytes: 0 %
Lymphocytes Relative: 34 %
Lymphs Abs: 2.5 10*3/uL (ref 0.7–4.0)
MCH: 28.4 pg (ref 26.0–34.0)
MCHC: 32.3 g/dL (ref 30.0–36.0)
MCV: 87.8 fL (ref 80.0–100.0)
Monocytes Absolute: 0.5 10*3/uL (ref 0.1–1.0)
Monocytes Relative: 7 %
Neutro Abs: 4 10*3/uL (ref 1.7–7.7)
Neutrophils Relative %: 57 %
Platelets: 318 10*3/uL (ref 150–400)
RBC: 4.9 MIL/uL (ref 3.87–5.11)
RDW: 13.1 % (ref 11.5–15.5)
WBC: 7.2 10*3/uL (ref 4.0–10.5)
nRBC: 0 % (ref 0.0–0.2)

## 2019-12-21 LAB — COMPREHENSIVE METABOLIC PANEL
ALT: 28 U/L (ref 0–44)
AST: 25 U/L (ref 15–41)
Albumin: 4 g/dL (ref 3.5–5.0)
Alkaline Phosphatase: 44 U/L (ref 38–126)
Anion gap: 10 (ref 5–15)
BUN: 8 mg/dL (ref 8–23)
CO2: 27 mmol/L (ref 22–32)
Calcium: 9 mg/dL (ref 8.9–10.3)
Chloride: 101 mmol/L (ref 98–111)
Creatinine, Ser: 0.87 mg/dL (ref 0.44–1.00)
GFR calc Af Amer: >60 mL/min (ref >60)
GFR calc non Af Amer: >60 mL/min (ref >60)
Glucose, Bld: 106 mg/dL — ABNORMAL HIGH (ref 70–99)
Potassium: 4.3 mmol/L (ref 3.5–5.1)
Sodium: 138 mmol/L (ref 135–145)
Total Bilirubin: 0.6 mg/dL (ref 0.3–1.2)
Total Protein: 6.8 g/dL (ref 6.5–8.1)

## 2019-12-21 LAB — TYPE AND SCREEN
ABO/RH(D): A POS
Antibody Screen: NEGATIVE

## 2019-12-21 LAB — PROTIME-INR
INR: 1 (ref 0.8–1.2)
Prothrombin Time: 12.5 seconds (ref 11.4–15.2)

## 2019-12-21 LAB — APTT: aPTT: 30 seconds (ref 24–36)

## 2019-12-21 LAB — SARS CORONAVIRUS 2 (TAT 6-24 HRS): SARS Coronavirus 2: NEGATIVE

## 2019-12-21 LAB — URINALYSIS, ROUTINE W REFLEX MICROSCOPIC
Bilirubin Urine: NEGATIVE
Glucose, UA: NEGATIVE mg/dL
Hgb urine dipstick: NEGATIVE
Ketones, ur: NEGATIVE mg/dL
Nitrite: NEGATIVE
Protein, ur: NEGATIVE mg/dL
Specific Gravity, Urine: 1.02 (ref 1.005–1.030)
pH: 5 (ref 5.0–8.0)

## 2019-12-21 LAB — ABO/RH: ABO/RH(D): A POS

## 2019-12-21 NOTE — Progress Notes (Signed)
PCP - Judd Lien  Chest x-ray - n/a EKG - n/a  ERAS Protcol - yes, Ensure ordered and given   COVID TEST- 12-21-19   Anesthesia review: n/a  Patient denies shortness of breath, fever, cough and chest pain at PAT appointment   All instructions explained to the patient, with a verbal understanding of the material. Patient agrees to go over the instructions while at home for a better understanding. Patient also instructed to self quarantine after being tested for COVID-19. The opportunity to ask questions was provided.

## 2019-12-23 ENCOUNTER — Ambulatory Visit (HOSPITAL_COMMUNITY): Payer: Medicare Other

## 2019-12-23 ENCOUNTER — Ambulatory Visit (HOSPITAL_COMMUNITY): Payer: Medicare Other | Admitting: Anesthesiology

## 2019-12-23 ENCOUNTER — Encounter (HOSPITAL_COMMUNITY)
Admission: RE | Disposition: A | Discharge status: Home / Self Care | Payer: Self-pay | Source: Home / Self Care | Attending: Orthopedic Surgery

## 2019-12-23 ENCOUNTER — Encounter (HOSPITAL_COMMUNITY): Payer: Self-pay | Admitting: Orthopedic Surgery

## 2019-12-23 ENCOUNTER — Other Ambulatory Visit: Payer: Self-pay

## 2019-12-23 ENCOUNTER — Ambulatory Visit (HOSPITAL_COMMUNITY)
Admission: RE | Admit: 2019-12-23 | Discharge: 2019-12-23 | Disposition: A | Discharge status: Home / Self Care | Payer: Medicare Other | Attending: Orthopedic Surgery | Admitting: Orthopedic Surgery

## 2019-12-23 DIAGNOSIS — M533 Sacrococcygeal disorders, not elsewhere classified: Secondary | ICD-10-CM | POA: Insufficient documentation

## 2019-12-23 DIAGNOSIS — M1990 Unspecified osteoarthritis, unspecified site: Secondary | ICD-10-CM | POA: Diagnosis not present

## 2019-12-23 DIAGNOSIS — M545 Low back pain: Secondary | ICD-10-CM | POA: Diagnosis not present

## 2019-12-23 DIAGNOSIS — Z419 Encounter for procedure for purposes other than remedying health state, unspecified: Secondary | ICD-10-CM

## 2019-12-23 DIAGNOSIS — F329 Major depressive disorder, single episode, unspecified: Secondary | ICD-10-CM | POA: Insufficient documentation

## 2019-12-23 DIAGNOSIS — F419 Anxiety disorder, unspecified: Secondary | ICD-10-CM | POA: Insufficient documentation

## 2019-12-23 DIAGNOSIS — K219 Gastro-esophageal reflux disease without esophagitis: Secondary | ICD-10-CM | POA: Diagnosis not present

## 2019-12-23 DIAGNOSIS — Z79899 Other long term (current) drug therapy: Secondary | ICD-10-CM | POA: Diagnosis not present

## 2019-12-23 DIAGNOSIS — F418 Other specified anxiety disorders: Secondary | ICD-10-CM | POA: Diagnosis not present

## 2019-12-23 HISTORY — PX: SACROILIAC JOINT FUSION: SHX6088

## 2019-12-23 SURGERY — SACROILIAC JOINT FUSION
Anesthesia: General | Site: Hip | Laterality: Right

## 2019-12-23 MED ORDER — FENTANYL CITRATE (PF) 250 MCG/5ML IJ SOLN
INTRAMUSCULAR | Status: AC
  Filled 2019-12-23: qty 5

## 2019-12-23 MED ORDER — PROPOFOL 500 MG/50ML IV EMUL
INTRAVENOUS | Status: DC | PRN
  Administered 2019-12-23: 20 ug/kg/min via INTRAVENOUS

## 2019-12-23 MED ORDER — MIDAZOLAM HCL 5 MG/5ML IJ SOLN
INTRAMUSCULAR | Status: DC | PRN
  Administered 2019-12-23: 2 mg via INTRAVENOUS

## 2019-12-23 MED ORDER — FENTANYL CITRATE (PF) 100 MCG/2ML IJ SOLN
INTRAMUSCULAR | Status: DC | PRN
  Administered 2019-12-23: 100 ug via INTRAVENOUS
  Administered 2019-12-23 (×2): 50 ug via INTRAVENOUS

## 2019-12-23 MED ORDER — POVIDONE-IODINE 7.5 % EX SOLN: Freq: Once | CUTANEOUS | Status: DC

## 2019-12-23 MED ORDER — PHENYLEPHRINE HCL (PRESSORS) 10 MG/ML IV SOLN
INTRAVENOUS | Status: DC | PRN
  Administered 2019-12-23: 160 ug via INTRAVENOUS
  Administered 2019-12-23: 80 ug via INTRAVENOUS

## 2019-12-23 MED ORDER — BUPIVACAINE LIPOSOME 1.3 % IJ SUSP: INTRAMUSCULAR | Status: DC | PRN

## 2019-12-23 MED ORDER — LIDOCAINE 2% (20 MG/ML) 5 ML SYRINGE
INTRAMUSCULAR | Status: DC | PRN
  Administered 2019-12-23: 40 mg via INTRAVENOUS

## 2019-12-23 MED ORDER — ONDANSETRON HCL 4 MG/2ML IJ SOLN
INTRAMUSCULAR | Status: AC
  Filled 2019-12-23: qty 2

## 2019-12-23 MED ORDER — OXYCODONE-ACETAMINOPHEN 5-325 MG PO TABS: 1.0000 | ORAL_TABLET | ORAL | 0 refills | Status: AC | PRN

## 2019-12-23 MED ORDER — ROCURONIUM BROMIDE 50 MG/5ML IV SOSY
PREFILLED_SYRINGE | INTRAVENOUS | Status: DC | PRN
  Administered 2019-12-23: 40 mg via INTRAVENOUS

## 2019-12-23 MED ORDER — SUGAMMADEX SODIUM 200 MG/2ML IV SOLN
INTRAVENOUS | Status: DC | PRN
  Administered 2019-12-23: 120 mg via INTRAVENOUS

## 2019-12-23 MED ORDER — PHENYLEPHRINE HCL (PRESSORS) 10 MG/ML IV SOLN
INTRAVENOUS | Status: AC
  Filled 2019-12-23: qty 1

## 2019-12-23 MED ORDER — FENTANYL CITRATE (PF) 100 MCG/2ML IJ SOLN
INTRAMUSCULAR | Status: AC
  Filled 2019-12-23: qty 2

## 2019-12-23 MED ORDER — ONDANSETRON HCL 4 MG/2ML IJ SOLN
INTRAMUSCULAR | Status: DC | PRN
  Administered 2019-12-23: 4 mg via INTRAVENOUS

## 2019-12-23 MED ORDER — BUPIVACAINE HCL (PF) 0.25 % IJ SOLN
INTRAMUSCULAR | Status: AC
  Filled 2019-12-23: qty 30

## 2019-12-23 MED ORDER — ONDANSETRON HCL 4 MG PO TABS: 4.0000 mg | ORAL_TABLET | Freq: Three times a day (TID) | ORAL | Status: AC | PRN

## 2019-12-23 MED ORDER — EPINEPHRINE PF 1 MG/ML IJ SOLN
INTRAMUSCULAR | Status: DC | PRN
  Administered 2019-12-23: .15 mL

## 2019-12-23 MED ORDER — EPHEDRINE SULFATE 50 MG/ML IJ SOLN
INTRAMUSCULAR | Status: DC | PRN
  Administered 2019-12-23: 5 mg via INTRAVENOUS
  Administered 2019-12-23: 10 mg via INTRAVENOUS

## 2019-12-23 MED ORDER — CEFAZOLIN SODIUM-DEXTROSE 2-4 GM/100ML-% IV SOLN
INTRAVENOUS | Status: AC
  Filled 2019-12-23: qty 100

## 2019-12-23 MED ORDER — PHENYLEPHRINE HCL-NACL 10-0.9 MG/250ML-% IV SOLN
INTRAVENOUS | Status: DC | PRN
  Administered 2019-12-23: 50 ug/min via INTRAVENOUS

## 2019-12-23 MED ORDER — LACTATED RINGERS IV SOLN: INTRAVENOUS | Status: DC

## 2019-12-23 MED ORDER — 0.9 % SODIUM CHLORIDE (POUR BTL) OPTIME
TOPICAL | Status: DC | PRN
  Administered 2019-12-23: 1000 mL

## 2019-12-23 MED ORDER — METHOCARBAMOL 500 MG PO TABS: 500.0000 mg | ORAL_TABLET | Freq: Four times a day (QID) | ORAL | 0 refills | Status: DC | PRN

## 2019-12-23 MED ORDER — DEXAMETHASONE SODIUM PHOSPHATE 10 MG/ML IJ SOLN
INTRAMUSCULAR | Status: DC | PRN
  Administered 2019-12-23: 4 mg via INTRAVENOUS

## 2019-12-23 MED ORDER — OXYCODONE HCL 5 MG/5ML PO SOLN: 5.0000 mg | Freq: Once | ORAL | Status: DC | PRN

## 2019-12-23 MED ORDER — CEFAZOLIN SODIUM-DEXTROSE 2-4 GM/100ML-% IV SOLN
2.0000 g | INTRAVENOUS | Status: AC
  Administered 2019-12-23: 2 g via INTRAVENOUS

## 2019-12-23 MED ORDER — MIDAZOLAM HCL 2 MG/2ML IJ SOLN
INTRAMUSCULAR | Status: AC
  Filled 2019-12-23: qty 2

## 2019-12-23 MED ORDER — PHENYLEPHRINE 40 MCG/ML (10ML) SYRINGE FOR IV PUSH (FOR BLOOD PRESSURE SUPPORT)
PREFILLED_SYRINGE | INTRAVENOUS | Status: AC
  Filled 2019-12-23: qty 10

## 2019-12-23 MED ORDER — OXYCODONE HCL 5 MG PO TABS: 5.0000 mg | ORAL_TABLET | Freq: Once | ORAL | Status: DC | PRN

## 2019-12-23 MED ORDER — ONDANSETRON HCL 4 MG PO TABS: 4.0000 mg | ORAL_TABLET | Freq: Three times a day (TID) | ORAL | 0 refills | Status: DC | PRN

## 2019-12-23 MED ORDER — PROPOFOL 10 MG/ML IV BOLUS
INTRAVENOUS | Status: DC | PRN
  Administered 2019-12-23: 100 mg via INTRAVENOUS

## 2019-12-23 MED ORDER — BUPIVACAINE LIPOSOME 1.3 % IJ SUSP
20.0000 mL | INTRAMUSCULAR | Status: AC
  Administered 2019-12-23: 20 mL
  Filled 2019-12-23: qty 20

## 2019-12-23 MED ORDER — ONDANSETRON HCL 4 MG/2ML IJ SOLN: 4.0000 mg | Freq: Once | INTRAMUSCULAR | Status: DC | PRN

## 2019-12-23 MED ORDER — FENTANYL CITRATE (PF) 100 MCG/2ML IJ SOLN
25.0000 ug | INTRAMUSCULAR | Status: DC | PRN
  Administered 2019-12-23 (×4): 25 ug via INTRAVENOUS

## 2019-12-23 MED ORDER — BUPIVACAINE HCL (PF) 0.25 % IJ SOLN
INTRAMUSCULAR | Status: DC | PRN
  Administered 2019-12-23: 30 mL

## 2019-12-23 MED ORDER — LACTATED RINGERS IV SOLN: INTRAVENOUS | Status: DC | PRN

## 2019-12-23 MED ORDER — STERILE WATER FOR IRRIGATION IR SOLN
Status: DC | PRN
  Administered 2019-12-23: 1000 mL

## 2019-12-23 MED ORDER — PROPOFOL 10 MG/ML IV BOLUS
INTRAVENOUS | Status: AC
  Filled 2019-12-23: qty 20

## 2019-12-23 MED ORDER — EPINEPHRINE PF 1 MG/ML IJ SOLN
INTRAMUSCULAR | Status: AC
  Filled 2019-12-23: qty 1

## 2019-12-23 SURGICAL SUPPLY — 70 items
BENZOIN TINCTURE PRP APPL 2/3 (GAUZE/BANDAGES/DRESSINGS) ×3 IMPLANT
BIT DRILL CANNULATED 7.0X3.2MM (BIT) IMPLANT
BLADE CLIPPER SURG (BLADE) ×3 IMPLANT
BLADE SURG 10 STRL SS (BLADE) ×3 IMPLANT
CANISTER SUCT 3000ML PPV (MISCELLANEOUS) ×3 IMPLANT
CLOSURE STERI-STRIP 1/2X4 (GAUZE/BANDAGES/DRESSINGS) ×1
CLOSURE WOUND 1/2 X4 (GAUZE/BANDAGES/DRESSINGS) ×1
CLSR STERI-STRIP ANTIMIC 1/2X4 (GAUZE/BANDAGES/DRESSINGS) ×1 IMPLANT
COVER SURGICAL LIGHT HANDLE (MISCELLANEOUS) ×6 IMPLANT
COVER WAND RF STERILE (DRAPES) ×3 IMPLANT
DRAPE C-ARM 42X72 X-RAY (DRAPES) ×3 IMPLANT
DRAPE C-ARMOR (DRAPES) ×3 IMPLANT
DRAPE INCISE IOBAN 66X45 STRL (DRAPES) ×3 IMPLANT
DRAPE POUCH INSTRU U-SHP 10X18 (DRAPES) ×3 IMPLANT
DRAPE SURG 17X23 STRL (DRAPES) ×9 IMPLANT
DRILL CANNULATED 7.0X3.2MM (BIT) ×3
DURAPREP 26ML APPLICATOR (WOUND CARE) ×3 IMPLANT
ELECT CAUTERY BLADE 6.4 (BLADE) ×3 IMPLANT
ELECT REM PT RETURN 9FT ADLT (ELECTROSURGICAL) ×3
ELECTRODE REM PT RTRN 9FT ADLT (ELECTROSURGICAL) ×1 IMPLANT
GAUZE 4X4 16PLY RFD (DISPOSABLE) ×3 IMPLANT
GAUZE SPONGE 4X4 12PLY STRL (GAUZE/BANDAGES/DRESSINGS) ×3 IMPLANT
GAUZE SPONGE 4X4 12PLY STRL LF (GAUZE/BANDAGES/DRESSINGS) ×2 IMPLANT
GLOVE BIO SURGEON STRL SZ7 (GLOVE) ×3 IMPLANT
GLOVE BIO SURGEON STRL SZ8 (GLOVE) ×3 IMPLANT
GLOVE BIOGEL PI IND STRL 7.0 (GLOVE) ×1 IMPLANT
GLOVE BIOGEL PI IND STRL 8 (GLOVE) ×1 IMPLANT
GLOVE BIOGEL PI INDICATOR 7.0 (GLOVE) ×2
GLOVE BIOGEL PI INDICATOR 8 (GLOVE) ×2
GOWN STRL REUS W/ TWL LRG LVL3 (GOWN DISPOSABLE) ×2 IMPLANT
GOWN STRL REUS W/ TWL XL LVL3 (GOWN DISPOSABLE) ×1 IMPLANT
GOWN STRL REUS W/TWL LRG LVL3 (GOWN DISPOSABLE) ×4
GOWN STRL REUS W/TWL XL LVL3 (GOWN DISPOSABLE) ×2
IMPL IFUSE 7.0X50MM (Rod) IMPLANT
IMPL IFUSE 7.0X60MM (Rod) IMPLANT
IMPLANT IFUSE 7.0MMX40MM (Rod) ×2 IMPLANT
IMPLANT IFUSE 7.0X50MM (Rod) ×3 IMPLANT
IMPLANT IFUSE 7.0X60MM (Rod) ×3 IMPLANT
KIT BASIN OR (CUSTOM PROCEDURE TRAY) ×3 IMPLANT
KIT TURNOVER KIT B (KITS) ×3 IMPLANT
MANIFOLD NEPTUNE II (INSTRUMENTS) ×3 IMPLANT
NDL HYPO 25GX1X1/2 BEV (NEEDLE) ×1 IMPLANT
NEEDLE 22X1 1/2 (OR ONLY) (NEEDLE) ×3 IMPLANT
NEEDLE HYPO 25GX1X1/2 BEV (NEEDLE) ×6 IMPLANT
NS IRRIG 1000ML POUR BTL (IV SOLUTION) ×3 IMPLANT
PACK UNIVERSAL I (CUSTOM PROCEDURE TRAY) ×3 IMPLANT
PAD ARMBOARD 7.5X6 YLW CONV (MISCELLANEOUS) ×6 IMPLANT
PENCIL BUTTON HOLSTER BLD 10FT (ELECTRODE) ×3 IMPLANT
PIN STEINMAN (PIN) ×2 IMPLANT
PIN STEINMAN 3.2MM (PIN) ×6 IMPLANT
SPONGE LAP 18X18 RF (DISPOSABLE) ×3 IMPLANT
STAPLER VISISTAT 35W (STAPLE) ×5 IMPLANT
STRIP CLOSURE SKIN 1/2X4 (GAUZE/BANDAGES/DRESSINGS) ×2 IMPLANT
SUT MNCRL AB 4-0 PS2 18 (SUTURE) ×3 IMPLANT
SUT VIC AB 0 CT1 18XCR BRD 8 (SUTURE) IMPLANT
SUT VIC AB 0 CT1 8-18 (SUTURE) ×2
SUT VIC AB 1 CT1 18XCR BRD 8 (SUTURE) ×1 IMPLANT
SUT VIC AB 1 CT1 8-18 (SUTURE) ×2
SUT VIC AB 2-0 CT2 18 VCP726D (SUTURE) ×3 IMPLANT
SYR BULB IRRIGATION 50ML (SYRINGE) ×6 IMPLANT
SYR CONTROL 10ML LL (SYRINGE) ×3 IMPLANT
SYS SPNL FX3ANG 40X7XFSN (Rod) ×1 IMPLANT
SYSTEM SPNL FX3ANG 40X7XFSN (Rod) IMPLANT
TAPE CLOTH SURG 4X10 WHT LF (GAUZE/BANDAGES/DRESSINGS) ×2 IMPLANT
TOWEL GREEN STERILE (TOWEL DISPOSABLE) ×6 IMPLANT
TOWEL GREEN STERILE FF (TOWEL DISPOSABLE) ×3 IMPLANT
TUBE CONNECTING 12'X1/4 (SUCTIONS) ×1
TUBE CONNECTING 12X1/4 (SUCTIONS) ×2 IMPLANT
WATER STERILE IRR 1000ML POUR (IV SOLUTION) ×3 IMPLANT
YANKAUER SUCT BULB TIP NO VENT (SUCTIONS) ×3 IMPLANT

## 2019-12-23 NOTE — Anesthesia Procedure Notes (Signed)
Procedure Name: Intubation Date/Time: 12/23/2019 12:25 PM Performed by: Inda Coke, CRNA Pre-anesthesia Checklist: Patient identified, Emergency Drugs available, Suction available and Patient being monitored Patient Re-evaluated:Patient Re-evaluated prior to induction Oxygen Delivery Method: Circle System Utilized Preoxygenation: Pre-oxygenation with 100% oxygen Induction Type: IV induction Ventilation: Mask ventilation without difficulty and Oral airway inserted - appropriate to patient size Laryngoscope Size: Mac and 3 Grade View: Grade II Tube type: Oral Tube size: 7.0 mm Number of attempts: 1 Airway Equipment and Method: Stylet and Oral airway Placement Confirmation: ETT inserted through vocal cords under direct vision,  positive ETCO2 and breath sounds checked- equal and bilateral Secured at: 21 cm Tube secured with: Tape Dental Injury: Teeth and Oropharynx as per pre-operative assessment

## 2019-12-23 NOTE — Transfer of Care (Signed)
Immediate Anesthesia Transfer of Care Note  Patient: Madeline Caldwell  Procedure(s) Performed: RIGHT SACROILIAC JOINT FUSION (Right Hip)  Patient Location: PACU  Anesthesia Type:General  Level of Consciousness: awake, alert  and oriented  Airway & Oxygen Therapy: Patient Spontanous Breathing and Patient connected to nasal cannula oxygen  Post-op Assessment: Report given to RN and Post -op Vital signs reviewed and stable  Post vital signs: Reviewed and stable  Last Vitals:  Vitals Value Taken Time  BP 125/60 12/23/19 1354  Temp    Pulse 87 12/23/19 1403  Resp 14 12/23/19 1403  SpO2 99 % 12/23/19 1403  Vitals shown include unvalidated device data.  Last Pain:  Vitals:   12/23/19 0902  TempSrc:   PainSc: 9          Complications: No apparent anesthesia complications

## 2019-12-23 NOTE — H&P (Signed)
PREOPERATIVE H&P  Chief Complaint: Right sided low back pain  HPI: Madeline Caldwell is a 74 y.o. female who presents with ongoing pain in the right low back  Patient's symptoms c/w right SI dysfunction  Patient has failed multiple forms of conservative care and continues to have pain (see office notes for additional details regarding the patient's full course of treatment)  Past Medical History:  Diagnosis Date  . Anxiety   . Arthritis    everywhere per patient  . Atypical nevus 04/01/2013   Right Post Buttock-Mild  . Depression   . GERD (gastroesophageal reflux disease)   . Headache    history of migraine headaches  . PONV (postoperative nausea and vomiting)   . 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 APPENDECTOMY  2013   done with gallbladder per pt.   Marland Kitchen BACK SURGERY  2019  . BREAST SURGERY  1999,1985   lumps removed  . CHOLECYSTECTOMY  2013  . COLON SURGERY  2018   Colon resection  . OVARIAN CYST REMOVAL  1982   Social History   Socioeconomic History  . Marital status: Married    Spouse name: Not on file  . Number of children: Not on file  . Years of education: Not on file  . Highest education level: Not on file  Occupational History  . Not on file  Tobacco Use  . Smoking status: Never Smoker  . Smokeless tobacco: Never Used  Substance and Sexual Activity  . Alcohol use: No  . Drug use: No  . Sexual activity: Not on file  Other Topics Concern  . Not on file  Social History Narrative  . Not on file   Social Determinants of Health   Financial Resource Strain:   . Difficulty of Paying Living Expenses: Not on file  Food Insecurity:   . Worried About Charity fundraiser in the Last Year: Not on file  . Ran Out of Food in the Last Year: Not on  file  Transportation Needs:   . Lack of Transportation (Medical): Not on file  . Lack of Transportation (Non-Medical): Not on file  Physical Activity:   . Days of Exercise per Week: Not on file  . Minutes of Exercise per Session: Not on file  Stress:   . Feeling of Stress : Not on file  Social Connections:   . Frequency of Communication with Friends and Family: Not on file  . Frequency of Social Gatherings with Friends and Family: Not on file  . Attends Religious Services: Not on file  . Active Member of Clubs or Organizations: Not on file  . Attends Archivist Meetings: Not on file  . Marital Status: Not on file   No family history on file. No Known Allergies Prior to Admission medications   Medication Sig Start Date End Date Taking? Authorizing Provider  Biotin w/ Vitamins C & E (HAIR/SKIN/NAILS PO) Take 1 tablet by mouth 2 (two) times daily.   Yes [provider]  cetirizine (ZYRTEC) 10 MG tablet Take 10 mg by mouth daily.   Yes [provider]  Ginkgo Biloba 120 MG TABS Take 120 mg by mouth daily.   Yes [provider]  HYDROcodone-acetaminophen (NORCO/VICODIN) 5-325 MG tablet Take 2  tablets by mouth 2 (two) times daily.   Yes [provider]  tiZANidine (ZANAFLEX) 2 MG tablet Take 2 mg by mouth 2 (two) times daily.   Yes [provider]  venlafaxine XR (EFFEXOR-XR) 75 MG 24 hr capsule Take 75 mg by mouth at bedtime.   Yes [provider]  cholecalciferol (VITAMIN D) 25 MCG (1000 UNIT) tablet Take 1,000 Units by mouth daily.    [provider]  fluoruracil (CARAC) 0.5 % cream Apply 1 application topically 2 (two) times a week.     [provider]  Lutein 40 MG CAPS Take 40 mg by mouth daily.    [provider]  omega-3 fish oil (MAXEPA) 1000 MG CAPS capsule Take 2,000 mg by mouth daily.    [provider]  tretinoin (RETIN-A) 0.025 % cream Apply 1 application topically 3 (three)  times a week.     [provider]     All other systems have been reviewed and were otherwise negative with the exception of those mentioned in the HPI and as above.  Physical Exam: There were no vitals filed for this visit.  There is no height or weight on file to calculate BMI.  General: Alert, no acute distress Cardiovascular: No pedal edema Respiratory: No cyanosis, no use of accessory musculature Skin: No lesions in the area of chief complaint Neurologic: Sensation intact distally Psychiatric: Patient is competent for consent with normal mood and affect Lymphatic: No axillary or cervical lymphadenopathy  MUSCULOSKELETAL: + TTP right low back  Assessment/Plan: RIGHT-SIDED SACROILIAC JOINT DYSFUNCTION Plan for Procedure(s): RIGHT SACROILIAC JOINT FUSION   Norva Karvonen, MD 12/23/2019 8:11 AM

## 2019-12-23 NOTE — Anesthesia Postprocedure Evaluation (Signed)
Anesthesia Post Note  Patient: Madeline Caldwell  Procedure(s) Performed: RIGHT SACROILIAC JOINT FUSION (Right Hip)     Patient location during evaluation: PACU Anesthesia Type: General Level of consciousness: awake and alert Pain management: pain level controlled Vital Signs Assessment: post-procedure vital signs reviewed and stable Respiratory status: spontaneous breathing, nonlabored ventilation and respiratory function stable Cardiovascular status: blood pressure returned to baseline and stable Postop Assessment: no apparent nausea or vomiting Anesthetic complications: no    Last Vitals:  Vitals:   12/23/19 1454 12/23/19 1458  BP: 123/73   Pulse: 85 85  Resp: 12 12  Temp:    SpO2: 94% 94%                   Audry Pili

## 2019-12-23 NOTE — Anesthesia Preprocedure Evaluation (Addendum)
Anesthesia Evaluation  Patient identified by MRN, date of birth, ID band Patient awake    Reviewed: Allergy & Precautions, NPO status , Patient's Chart, lab work & pertinent test results  History of Anesthesia Complications (+) PONV and history of anesthetic complications  Airway Mallampati: II  TM Distance: >3 FB Neck ROM: Full    Dental  (+) Dental Advisory Given, Teeth Intact   Pulmonary neg pulmonary ROS,    Pulmonary exam normal        Cardiovascular negative cardio ROS Normal cardiovascular exam     Neuro/Psych  Headaches, PSYCHIATRIC DISORDERS Anxiety Depression    GI/Hepatic Neg liver ROS, GERD  Controlled,  Endo/Other  negative endocrine ROS  Renal/GU negative Renal ROS     Musculoskeletal  (+) Arthritis ,   Abdominal   Peds  Hematology negative hematology ROS (+)   Anesthesia Other Findings Covid neg 1/18   Reproductive/Obstetrics                            Anesthesia Physical Anesthesia Plan  ASA: II  Anesthesia Plan: General   Post-op Pain Management:    Induction: Intravenous  PONV Risk Score and Plan: 4 or greater and Treatment may vary due to age or medical condition, Ondansetron, Dexamethasone and Propofol infusion  Airway Management Planned: Oral ETT  Additional Equipment: None  Intra-op Plan:   Post-operative Plan: Extubation in OR  Informed Consent: I have reviewed the patients History and Physical, chart, labs and discussed the procedure including the risks, benefits and alternatives for the proposed anesthesia with the patient or authorized representative who has indicated his/her understanding and acceptance.     Dental advisory given  Plan Discussed with: CRNA and Anesthesiologist  Anesthesia Plan Comments:        Anesthesia Quick Evaluation

## 2019-12-23 NOTE — Op Note (Signed)
NAME:  Madeline Caldwell        MEDICAL RECORD NO.:  [id number]   DATE OF BIRTH:  08-Feb-1946   DATE OF PROCEDURE:  12/23/2019                               OPERATIVE REPORT     PREOPERATIVE DIAGNOSES: 1.  Right sacroiliac joint dysfunction   POSTOPERATIVE DIAGNOSES: 1.  Right sacroiliac joint dysfunction   PROCEDURES: Minimally invasive right sacroiliac joint fusion   SURGEON:  Phylliss Bob, MD.   ASSISTANTPricilla Holm, PA-C.   ANESTHESIA:  General endotracheal anesthesia.   COMPLICATIONS:  None.   DISPOSITION:  Stable.   ESTIMATED BLOOD LOSS:  Minimal.   INDICATIONS FOR SURGERY:  Briefly, [name] is a pleasant 74 year old female, who did present to me with ongoing severe pain at the right side of her low back, status post a work injury.  Her work-up was diagnostic for right sacroiliac joint dysfunction.  She did fail appropriate conservative treatment measures, and as such, we did discuss proceeding with the surgery noted above.  The patient was made fully aware of the risks and recovery period associated with surgery, and she did wish to proceed.   OPERATIVE DETAILS:  On 12/23/2019, the patient was brought to surgery and general endotracheal anesthesia was administered.  The patient was placed prone on a flat Jackson bed, with gel rolls placed beneath the patient's chest and hips.  The region of the right buttock was prepped and draped in the usual sterile fashion.  A timeout procedure was performed.  Fluoroscopy was brought into the field.  I was able to ensure adequate lateral, inlet, and outlet radiographs.  At this point, a 3 cm incision was made in line with the posterior border of the sacrum on the right.  3 guidewires were advanced across the sacroiliac joint on the right side.  Fluoroscopy was liberally used while advancing the guidewires in order to ensure a safe trajectory of the guidewires..  I then drilled and broached over the guidewires.  At this point, 7 mm  implants were advanced across the sacroiliac joints from superiorly to inferiorly, the length of the implants were 60 mm, 50 mm, and 40 mm.  I was very pleased with the resting position of the implants on lateral, inlet, and outlet fluoroscopy.  The guidewires were then removed.  I was very pleased with the final lateral, inlet, and outlet radiographs.  At this point, the wound was copiously irrigated and closed in layers, using #1 Vicryl, followed by 2-0 Vicryl, followed by 4-0 Monocryl.  All instrument counts were correct at the termination of the procedure.    Of note, Pricilla Holm was my assistant throughout surgery, and did aid in retraction, suctioning, and closure from start to finish.     Phylliss Bob, MD

## 2019-12-24 ENCOUNTER — Encounter: Payer: Self-pay | Admitting: *Deleted

## 2019-12-25 DIAGNOSIS — K5900 Constipation, unspecified: Secondary | ICD-10-CM | POA: Diagnosis not present

## 2019-12-25 DIAGNOSIS — Z4789 Encounter for other orthopedic aftercare: Secondary | ICD-10-CM | POA: Diagnosis not present

## 2019-12-25 DIAGNOSIS — R262 Difficulty in walking, not elsewhere classified: Secondary | ICD-10-CM | POA: Diagnosis not present

## 2019-12-25 DIAGNOSIS — I10 Essential (primary) hypertension: Secondary | ICD-10-CM | POA: Diagnosis not present

## 2019-12-25 DIAGNOSIS — F329 Major depressive disorder, single episode, unspecified: Secondary | ICD-10-CM | POA: Diagnosis not present

## 2019-12-30 DIAGNOSIS — R262 Difficulty in walking, not elsewhere classified: Secondary | ICD-10-CM | POA: Diagnosis not present

## 2019-12-30 DIAGNOSIS — K5900 Constipation, unspecified: Secondary | ICD-10-CM | POA: Diagnosis not present

## 2019-12-30 DIAGNOSIS — Z4789 Encounter for other orthopedic aftercare: Secondary | ICD-10-CM | POA: Diagnosis not present

## 2019-12-30 DIAGNOSIS — F329 Major depressive disorder, single episode, unspecified: Secondary | ICD-10-CM | POA: Diagnosis not present

## 2020-01-08 DIAGNOSIS — Z9889 Other specified postprocedural states: Secondary | ICD-10-CM | POA: Diagnosis not present

## 2020-01-24 DIAGNOSIS — F329 Major depressive disorder, single episode, unspecified: Secondary | ICD-10-CM | POA: Diagnosis not present

## 2020-01-24 DIAGNOSIS — R262 Difficulty in walking, not elsewhere classified: Secondary | ICD-10-CM | POA: Diagnosis not present

## 2020-01-24 DIAGNOSIS — K5900 Constipation, unspecified: Secondary | ICD-10-CM | POA: Diagnosis not present

## 2020-01-24 DIAGNOSIS — Z4789 Encounter for other orthopedic aftercare: Secondary | ICD-10-CM | POA: Diagnosis not present

## 2020-01-25 DIAGNOSIS — Z4789 Encounter for other orthopedic aftercare: Secondary | ICD-10-CM | POA: Diagnosis not present

## 2020-01-25 DIAGNOSIS — F329 Major depressive disorder, single episode, unspecified: Secondary | ICD-10-CM | POA: Diagnosis not present

## 2020-01-25 DIAGNOSIS — K5900 Constipation, unspecified: Secondary | ICD-10-CM | POA: Diagnosis not present

## 2020-01-25 DIAGNOSIS — R262 Difficulty in walking, not elsewhere classified: Secondary | ICD-10-CM | POA: Diagnosis not present

## 2020-01-28 DIAGNOSIS — F329 Major depressive disorder, single episode, unspecified: Secondary | ICD-10-CM | POA: Diagnosis not present

## 2020-01-28 DIAGNOSIS — R262 Difficulty in walking, not elsewhere classified: Secondary | ICD-10-CM | POA: Diagnosis not present

## 2020-01-28 DIAGNOSIS — Z4789 Encounter for other orthopedic aftercare: Secondary | ICD-10-CM | POA: Diagnosis not present

## 2020-01-28 DIAGNOSIS — K5900 Constipation, unspecified: Secondary | ICD-10-CM | POA: Diagnosis not present

## 2020-02-05 DIAGNOSIS — K5900 Constipation, unspecified: Secondary | ICD-10-CM | POA: Diagnosis not present

## 2020-02-05 DIAGNOSIS — R262 Difficulty in walking, not elsewhere classified: Secondary | ICD-10-CM | POA: Diagnosis not present

## 2020-02-05 DIAGNOSIS — Z4789 Encounter for other orthopedic aftercare: Secondary | ICD-10-CM | POA: Diagnosis not present

## 2020-02-05 DIAGNOSIS — F329 Major depressive disorder, single episode, unspecified: Secondary | ICD-10-CM | POA: Diagnosis not present

## 2020-02-12 DIAGNOSIS — L570 Actinic keratosis: Secondary | ICD-10-CM | POA: Diagnosis not present

## 2020-02-12 DIAGNOSIS — Z9889 Other specified postprocedural states: Secondary | ICD-10-CM | POA: Diagnosis not present

## 2020-02-12 DIAGNOSIS — Z1231 Encounter for screening mammogram for malignant neoplasm of breast: Secondary | ICD-10-CM | POA: Diagnosis not present

## 2020-02-12 DIAGNOSIS — M533 Sacrococcygeal disorders, not elsewhere classified: Secondary | ICD-10-CM | POA: Diagnosis not present

## 2020-02-12 DIAGNOSIS — F419 Anxiety disorder, unspecified: Secondary | ICD-10-CM | POA: Diagnosis not present

## 2020-02-12 DIAGNOSIS — Z124 Encounter for screening for malignant neoplasm of cervix: Secondary | ICD-10-CM | POA: Diagnosis not present

## 2020-02-12 DIAGNOSIS — M810 Age-related osteoporosis without current pathological fracture: Secondary | ICD-10-CM | POA: Diagnosis not present

## 2020-02-15 DIAGNOSIS — F321 Major depressive disorder, single episode, moderate: Secondary | ICD-10-CM | POA: Diagnosis not present

## 2020-02-23 DIAGNOSIS — Z9889 Other specified postprocedural states: Secondary | ICD-10-CM | POA: Diagnosis not present

## 2020-02-23 DIAGNOSIS — M545 Low back pain: Secondary | ICD-10-CM | POA: Diagnosis not present

## 2020-02-25 DIAGNOSIS — M545 Low back pain: Secondary | ICD-10-CM | POA: Diagnosis not present

## 2020-02-25 DIAGNOSIS — Z9889 Other specified postprocedural states: Secondary | ICD-10-CM | POA: Diagnosis not present

## 2020-03-01 DIAGNOSIS — Z9889 Other specified postprocedural states: Secondary | ICD-10-CM | POA: Diagnosis not present

## 2020-03-01 DIAGNOSIS — M545 Low back pain: Secondary | ICD-10-CM | POA: Diagnosis not present

## 2020-03-02 DIAGNOSIS — Z9889 Other specified postprocedural states: Secondary | ICD-10-CM | POA: Diagnosis not present

## 2020-03-02 DIAGNOSIS — F321 Major depressive disorder, single episode, moderate: Secondary | ICD-10-CM | POA: Diagnosis not present

## 2020-03-02 DIAGNOSIS — M545 Low back pain: Secondary | ICD-10-CM | POA: Diagnosis not present

## 2020-03-04 DIAGNOSIS — Z9889 Other specified postprocedural states: Secondary | ICD-10-CM | POA: Diagnosis not present

## 2020-03-04 DIAGNOSIS — M545 Low back pain: Secondary | ICD-10-CM | POA: Diagnosis not present

## 2020-03-14 DIAGNOSIS — Z9889 Other specified postprocedural states: Secondary | ICD-10-CM | POA: Diagnosis not present

## 2020-03-14 DIAGNOSIS — M545 Low back pain: Secondary | ICD-10-CM | POA: Diagnosis not present

## 2020-03-17 DIAGNOSIS — M545 Low back pain: Secondary | ICD-10-CM | POA: Diagnosis not present

## 2020-03-17 DIAGNOSIS — Z9889 Other specified postprocedural states: Secondary | ICD-10-CM | POA: Diagnosis not present

## 2020-03-18 DIAGNOSIS — Z9889 Other specified postprocedural states: Secondary | ICD-10-CM | POA: Diagnosis not present

## 2020-03-18 DIAGNOSIS — M545 Low back pain: Secondary | ICD-10-CM | POA: Diagnosis not present

## 2020-03-21 DIAGNOSIS — M545 Low back pain: Secondary | ICD-10-CM | POA: Diagnosis not present

## 2020-03-21 DIAGNOSIS — Z9889 Other specified postprocedural states: Secondary | ICD-10-CM | POA: Diagnosis not present

## 2020-03-23 DIAGNOSIS — M545 Low back pain: Secondary | ICD-10-CM | POA: Diagnosis not present

## 2020-03-23 DIAGNOSIS — Z9889 Other specified postprocedural states: Secondary | ICD-10-CM | POA: Diagnosis not present

## 2020-03-24 DIAGNOSIS — M545 Low back pain: Secondary | ICD-10-CM | POA: Diagnosis not present

## 2020-03-24 DIAGNOSIS — Z9889 Other specified postprocedural states: Secondary | ICD-10-CM | POA: Diagnosis not present

## 2020-03-25 DIAGNOSIS — M533 Sacrococcygeal disorders, not elsewhere classified: Secondary | ICD-10-CM | POA: Diagnosis not present

## 2020-03-25 DIAGNOSIS — F321 Major depressive disorder, single episode, moderate: Secondary | ICD-10-CM | POA: Diagnosis not present

## 2020-03-28 DIAGNOSIS — Z9889 Other specified postprocedural states: Secondary | ICD-10-CM | POA: Diagnosis not present

## 2020-03-28 DIAGNOSIS — M545 Low back pain: Secondary | ICD-10-CM | POA: Diagnosis not present

## 2020-03-29 DIAGNOSIS — H25041 Posterior subcapsular polar age-related cataract, right eye: Secondary | ICD-10-CM | POA: Diagnosis not present

## 2020-03-29 DIAGNOSIS — H5203 Hypermetropia, bilateral: Secondary | ICD-10-CM | POA: Diagnosis not present

## 2020-03-29 DIAGNOSIS — H52223 Regular astigmatism, bilateral: Secondary | ICD-10-CM | POA: Diagnosis not present

## 2020-03-29 DIAGNOSIS — H43813 Vitreous degeneration, bilateral: Secondary | ICD-10-CM | POA: Diagnosis not present

## 2020-03-29 DIAGNOSIS — M810 Age-related osteoporosis without current pathological fracture: Secondary | ICD-10-CM | POA: Diagnosis not present

## 2020-03-29 DIAGNOSIS — H524 Presbyopia: Secondary | ICD-10-CM | POA: Diagnosis not present

## 2020-03-30 DIAGNOSIS — M545 Low back pain: Secondary | ICD-10-CM | POA: Diagnosis not present

## 2020-03-30 DIAGNOSIS — Z9889 Other specified postprocedural states: Secondary | ICD-10-CM | POA: Diagnosis not present

## 2020-04-01 DIAGNOSIS — M545 Low back pain: Secondary | ICD-10-CM | POA: Diagnosis not present

## 2020-04-01 DIAGNOSIS — Z9889 Other specified postprocedural states: Secondary | ICD-10-CM | POA: Diagnosis not present

## 2020-04-05 DIAGNOSIS — Z9889 Other specified postprocedural states: Secondary | ICD-10-CM | POA: Diagnosis not present

## 2020-04-05 DIAGNOSIS — M545 Low back pain: Secondary | ICD-10-CM | POA: Diagnosis not present

## 2020-04-06 DIAGNOSIS — Z9889 Other specified postprocedural states: Secondary | ICD-10-CM | POA: Diagnosis not present

## 2020-04-06 DIAGNOSIS — M545 Low back pain: Secondary | ICD-10-CM | POA: Diagnosis not present

## 2020-04-08 DIAGNOSIS — Z9889 Other specified postprocedural states: Secondary | ICD-10-CM | POA: Diagnosis not present

## 2020-04-08 DIAGNOSIS — M545 Low back pain: Secondary | ICD-10-CM | POA: Diagnosis not present

## 2020-04-13 DIAGNOSIS — F321 Major depressive disorder, single episode, moderate: Secondary | ICD-10-CM | POA: Diagnosis not present

## 2020-04-26 ENCOUNTER — Other Ambulatory Visit: Payer: Self-pay

## 2020-04-26 ENCOUNTER — Ambulatory Visit (INDEPENDENT_AMBULATORY_CARE_PROVIDER_SITE_OTHER): Payer: Medicare Other | Admitting: Physician Assistant

## 2020-04-26 ENCOUNTER — Encounter: Payer: Self-pay | Admitting: Physician Assistant

## 2020-04-26 DIAGNOSIS — D485 Neoplasm of uncertain behavior of skin: Secondary | ICD-10-CM | POA: Diagnosis not present

## 2020-04-26 DIAGNOSIS — L578 Other skin changes due to chronic exposure to nonionizing radiation: Secondary | ICD-10-CM

## 2020-04-26 DIAGNOSIS — L814 Other melanin hyperpigmentation: Secondary | ICD-10-CM

## 2020-04-26 DIAGNOSIS — D1800 Hemangioma unspecified site: Secondary | ICD-10-CM

## 2020-04-26 DIAGNOSIS — L570 Actinic keratosis: Secondary | ICD-10-CM | POA: Diagnosis not present

## 2020-04-26 DIAGNOSIS — Z1283 Encounter for screening for malignant neoplasm of skin: Secondary | ICD-10-CM

## 2020-04-26 DIAGNOSIS — L821 Other seborrheic keratosis: Secondary | ICD-10-CM

## 2020-04-26 NOTE — Patient Instructions (Signed)

## 2020-04-26 NOTE — Progress Notes (Deleted)
Follow-Up Visit   Subjective  Madeline Caldwell is a 74 y.o. female who presents for the following: Skin Problem (face - about 5 lesions that are sore and scaly).   The following portions of the chart were reviewed this encounter and updated as appropriate: Tobacco  Allergies  Meds  Problems  Med Hx  Surg Hx  Fam Hx      Objective  Well appearing patient in no apparent distress; mood and affect are within normal limits.  All skin waist up examined.  Objective  Left Forehead: Hyperkeratotic scale with pink base      Objective  Mid Forehead: Hyperkeratotic scale with pink base      Assessment & Plan  Neoplasm of uncertain behavior of skin (2) Left Forehead  Skin / nail biopsy Type of biopsy: tangential   Informed consent: discussed and consent obtained   Timeout: patient name, date of birth, surgical site, and procedure verified   Procedure prep:  Patient was prepped and draped in usual sterile fashion Prep type:  Chlorhexidine Anesthesia: the lesion was anesthetized in a standard fashion   Anesthetic:  1% lidocaine w/ epinephrine 1-100,000 local infiltration Instrument used: flexible razor blade   Hemostasis achieved with: aluminum chloride and electrodesiccation   Outcome: patient tolerated procedure well   Post-procedure details: wound care instructions given   Additional details:  PERFORMED ED&C AFTER BIOPSY   Destruction of lesion Complexity: simple   Destruction method: electrodesiccation and curettage   Informed consent: discussed and consent obtained   Timeout:  patient name, date of birth, surgical site, and procedure verified Anesthesia: the lesion was anesthetized in a standard fashion   Anesthetic:  1% lidocaine w/ epinephrine 1-100,000 local infiltration Curettage performed in three different directions: Yes   Electrodesiccation performed over the curetted area: Yes   Curettage cycles:  3 Lesion length (cm):  1.1 Lesion width (cm):   1.1 Margin per side (cm):  0.1 Final wound size (cm):  1.3 Hemostasis achieved with:  aluminum chloride Outcome: patient tolerated procedure well with no complications   Post-procedure details: wound care instructions given    Specimen 1 - Surgical pathology Differential Diagnosis: bcc Check Margins: No Tx p bx Curet  1.1cm  Mid Forehead  Skin / nail biopsy Type of biopsy: tangential   Informed consent: discussed and consent obtained   Timeout: patient name, date of birth, surgical site, and procedure verified   Anesthesia: the lesion was anesthetized in a standard fashion   Anesthetic:  1% lidocaine w/ epinephrine 1-100,000 local infiltration Instrument used: flexible razor blade   Hemostasis achieved with: aluminum chloride and electrodesiccation   Outcome: patient tolerated procedure well   Post-procedure details: wound care instructions given    Destruction of lesion Complexity: simple   Destruction method: electrodesiccation and curettage   Informed consent: discussed and consent obtained   Timeout:  patient name, date of birth, surgical site, and procedure verified Anesthesia: the lesion was anesthetized in a standard fashion   Anesthetic:  1% lidocaine w/ epinephrine 1-100,000 local infiltration Curettage performed in three different directions: Yes   Electrodesiccation performed over the curetted area: Yes   Curettage cycles:  3 Lesion length (cm):  1.1 Lesion width (cm):  0.1 Margin per side (cm):  0.1 Final wound size (cm):  1.3 Hemostasis achieved with:  aluminum chloride Outcome: patient tolerated procedure well with no complications   Post-procedure details: wound care instructions given    Specimen 2 - Surgical pathology Differential Diagnosis: bcc  vs scc Check Margins: No Txpbx Curet 1.1cm

## 2020-05-05 DIAGNOSIS — F321 Major depressive disorder, single episode, moderate: Secondary | ICD-10-CM | POA: Diagnosis not present

## 2020-05-18 ENCOUNTER — Telehealth: Payer: Self-pay | Admitting: Physician Assistant

## 2020-05-18 NOTE — Telephone Encounter (Signed)
Results. If no answer call [phone]

## 2020-05-19 NOTE — Telephone Encounter (Signed)
Phone call to patient with her pathology results. Patient aware of results.  

## 2020-06-08 ENCOUNTER — Encounter: Payer: Self-pay | Admitting: *Deleted

## 2020-06-14 ENCOUNTER — Ambulatory Visit: Payer: Medicare Other | Admitting: Physician Assistant

## 2020-06-30 DIAGNOSIS — F321 Major depressive disorder, single episode, moderate: Secondary | ICD-10-CM | POA: Diagnosis not present

## 2020-07-05 DIAGNOSIS — M65341 Trigger finger, right ring finger: Secondary | ICD-10-CM | POA: Diagnosis not present

## 2020-07-05 DIAGNOSIS — M1811 Unilateral primary osteoarthritis of first carpometacarpal joint, right hand: Secondary | ICD-10-CM | POA: Diagnosis not present

## 2020-07-05 DIAGNOSIS — M1812 Unilateral primary osteoarthritis of first carpometacarpal joint, left hand: Secondary | ICD-10-CM | POA: Diagnosis not present

## 2020-07-05 DIAGNOSIS — G5601 Carpal tunnel syndrome, right upper limb: Secondary | ICD-10-CM | POA: Diagnosis not present

## 2020-07-05 DIAGNOSIS — G5603 Carpal tunnel syndrome, bilateral upper limbs: Secondary | ICD-10-CM | POA: Diagnosis not present

## 2020-07-05 DIAGNOSIS — G5602 Carpal tunnel syndrome, left upper limb: Secondary | ICD-10-CM | POA: Diagnosis not present

## 2020-07-12 ENCOUNTER — Encounter: Payer: Self-pay | Admitting: Physician Assistant

## 2020-07-12 NOTE — Progress Notes (Deleted)
I, Tahiri Shareef, PA-C, have reviewed all documentation for this visit. The documentation on 07/12/20 for the exam, diagnosis, procedures, and orders are all accurate and complete.

## 2020-07-21 DIAGNOSIS — F321 Major depressive disorder, single episode, moderate: Secondary | ICD-10-CM | POA: Diagnosis not present

## 2020-07-22 NOTE — Progress Notes (Addendum)
Follow-Up Visit   Subjective  Madeline Caldwell is a 74 y.o. female who presents for the following: Skin Problem (face - about 5 lesions that are sore and scaly).   The following portions of the chart were reviewed this encounter and updated as appropriate: Tobacco  Allergies  Meds  Problems  Med Hx  Surg Hx  Fam Hx      Objective  Well appearing patient in no apparent distress; mood and affect are within normal limits.  All skin waist up examined.  Objective  Left Forehead: Hyperkeratotic scale with pink base      Objective  Mid Forehead: Hyperkeratotic scale with pink base      Assessment & Plan  Neoplasm of uncertain behavior of skin (2) Left Forehead  Skin / nail biopsy Type of biopsy: tangential   Informed consent: discussed and consent obtained   Timeout: patient name, date of birth, surgical site, and procedure verified   Procedure prep:  Patient was prepped and draped in usual sterile fashion Prep type:  Chlorhexidine Anesthesia: the lesion was anesthetized in a standard fashion   Anesthetic:  1% lidocaine w/ epinephrine 1-100,000 local infiltration Instrument used: flexible razor blade   Hemostasis achieved with: aluminum chloride and electrodesiccation   Outcome: patient tolerated procedure well   Post-procedure details: wound care instructions given   Additional details:  PERFORMED ED&C AFTER BIOPSY   Destruction of lesion Complexity: simple   Destruction method: electrodesiccation and curettage   Informed consent: discussed and consent obtained   Timeout:  patient name, date of birth, surgical site, and procedure verified Anesthesia: the lesion was anesthetized in a standard fashion   Anesthetic:  1% lidocaine w/ epinephrine 1-100,000 local infiltration Curettage performed in three different directions: Yes   Electrodesiccation performed over the curetted area: Yes   Curettage cycles:  3 Lesion length (cm):  1.1 Lesion width (cm):   1.1 Margin per side (cm):  0.1 Final wound size (cm):  1.3 Hemostasis achieved with:  aluminum chloride Outcome: patient tolerated procedure well with no complications   Post-procedure details: wound care instructions given    Specimen 1 - Surgical pathology Differential Diagnosis: bcc Check Margins: No Tx p bx Curet  1.1cm  Mid Forehead  Skin / nail biopsy Type of biopsy: tangential   Informed consent: discussed and consent obtained   Timeout: patient name, date of birth, surgical site, and procedure verified   Anesthesia: the lesion was anesthetized in a standard fashion   Anesthetic:  1% lidocaine w/ epinephrine 1-100,000 local infiltration Instrument used: flexible razor blade   Hemostasis achieved with: aluminum chloride and electrodesiccation   Outcome: patient tolerated procedure well   Post-procedure details: wound care instructions given    Destruction of lesion Complexity: simple   Destruction method: electrodesiccation and curettage   Informed consent: discussed and consent obtained   Timeout:  patient name, date of birth, surgical site, and procedure verified Anesthesia: the lesion was anesthetized in a standard fashion   Anesthetic:  1% lidocaine w/ epinephrine 1-100,000 local infiltration Curettage performed in three different directions: Yes   Electrodesiccation performed over the curetted area: Yes   Curettage cycles:  3 Lesion length (cm):  1.1 Lesion width (cm):  0.1 Margin per side (cm):  0.1 Final wound size (cm):  1.3 Hemostasis achieved with:  aluminum chloride Outcome: patient tolerated procedure well with no complications   Post-procedure details: wound care instructions given    Specimen 2 - Surgical pathology Differential Diagnosis: bcc  vs scc Check Margins: No Txpbx Curet 1.1cm   Lentigines - Scattered tan macules - Discussed due to sun exposure - Benign, observe - Call for any changes  Seborrheic Keratoses - Stuck-on, waxy,  tan-brown papules and plaques  - Discussed benign etiology and prognosis. - Observe - Call for any changes    Hemangiomas - Red papules - Discussed benign nature - Observe - Call for any changes  Actinic Damage - diffuse scaly erythematous macules with underlying dyspigmentation - Recommend daily broad spectrum sunscreen SPF 30+ to sun-exposed areas, reapply every 2 hours as needed.  - Call for new or changing lesions.  Skin cancer screening performed today.  I, Katiejo Gilroy, PA-C, have reviewed all documentation's for this visit.  The documentation on 07/22/20 for the exam, diagnosis, procedures and orders are all accurate and complete.

## 2020-07-28 NOTE — Addendum Note (Signed)
Addended by: Robyne Askew R on: 07/28/2020 12:53 PM   Modules accepted: Level of Service

## 2020-08-04 DIAGNOSIS — G5601 Carpal tunnel syndrome, right upper limb: Secondary | ICD-10-CM | POA: Diagnosis not present

## 2020-08-04 DIAGNOSIS — M1811 Unilateral primary osteoarthritis of first carpometacarpal joint, right hand: Secondary | ICD-10-CM | POA: Diagnosis not present

## 2020-08-04 DIAGNOSIS — G5602 Carpal tunnel syndrome, left upper limb: Secondary | ICD-10-CM | POA: Diagnosis not present

## 2020-08-04 DIAGNOSIS — G5603 Carpal tunnel syndrome, bilateral upper limbs: Secondary | ICD-10-CM | POA: Diagnosis not present

## 2020-08-04 DIAGNOSIS — M65341 Trigger finger, right ring finger: Secondary | ICD-10-CM | POA: Diagnosis not present

## 2020-08-04 DIAGNOSIS — M1812 Unilateral primary osteoarthritis of first carpometacarpal joint, left hand: Secondary | ICD-10-CM | POA: Diagnosis not present

## 2020-08-23 DIAGNOSIS — F321 Major depressive disorder, single episode, moderate: Secondary | ICD-10-CM | POA: Diagnosis not present

## 2020-08-25 ENCOUNTER — Ambulatory Visit: Payer: Medicare Other | Admitting: Physician Assistant

## 2020-09-20 DIAGNOSIS — R252 Cramp and spasm: Secondary | ICD-10-CM | POA: Diagnosis not present

## 2020-09-20 DIAGNOSIS — M5431 Sciatica, right side: Secondary | ICD-10-CM | POA: Diagnosis not present

## 2020-09-23 ENCOUNTER — Ambulatory Visit: Payer: Medicare Other | Admitting: Physician Assistant

## 2020-10-11 DIAGNOSIS — F321 Major depressive disorder, single episode, moderate: Secondary | ICD-10-CM | POA: Diagnosis not present

## 2020-11-01 DIAGNOSIS — E782 Mixed hyperlipidemia: Secondary | ICD-10-CM | POA: Diagnosis not present

## 2020-11-01 DIAGNOSIS — E559 Vitamin D deficiency, unspecified: Secondary | ICD-10-CM | POA: Diagnosis not present

## 2020-11-01 DIAGNOSIS — M65341 Trigger finger, right ring finger: Secondary | ICD-10-CM | POA: Diagnosis not present

## 2020-11-01 DIAGNOSIS — G5602 Carpal tunnel syndrome, left upper limb: Secondary | ICD-10-CM | POA: Diagnosis not present

## 2020-11-01 DIAGNOSIS — Z20828 Contact with and (suspected) exposure to other viral communicable diseases: Secondary | ICD-10-CM | POA: Diagnosis not present

## 2020-11-01 DIAGNOSIS — M1812 Unilateral primary osteoarthritis of first carpometacarpal joint, left hand: Secondary | ICD-10-CM | POA: Diagnosis not present

## 2020-11-18 DIAGNOSIS — M25842 Other specified joint disorders, left hand: Secondary | ICD-10-CM | POA: Diagnosis not present

## 2020-11-18 DIAGNOSIS — M19042 Primary osteoarthritis, left hand: Secondary | ICD-10-CM | POA: Diagnosis not present

## 2020-11-18 DIAGNOSIS — G5602 Carpal tunnel syndrome, left upper limb: Secondary | ICD-10-CM | POA: Diagnosis not present

## 2020-11-18 DIAGNOSIS — M20092 Other deformity of left finger(s): Secondary | ICD-10-CM | POA: Diagnosis not present

## 2020-11-18 DIAGNOSIS — M1812 Unilateral primary osteoarthritis of first carpometacarpal joint, left hand: Secondary | ICD-10-CM | POA: Diagnosis not present

## 2020-11-18 DIAGNOSIS — G8918 Other acute postprocedural pain: Secondary | ICD-10-CM | POA: Diagnosis not present

## 2020-11-30 DIAGNOSIS — F321 Major depressive disorder, single episode, moderate: Secondary | ICD-10-CM | POA: Diagnosis not present

## 2020-12-13 ENCOUNTER — Ambulatory Visit: Payer: Medicare Other | Admitting: Physician Assistant

## 2021-04-06 ENCOUNTER — Other Ambulatory Visit: Payer: Self-pay | Admitting: Obstetrics

## 2021-04-11 ENCOUNTER — Other Ambulatory Visit: Payer: Self-pay | Admitting: Obstetrics

## 2021-04-20 ENCOUNTER — Ambulatory Visit (HOSPITAL_COMMUNITY)
Admission: RE | Admit: 2021-04-20 | Discharge: 2021-04-20 | Disposition: A | Discharge status: Home / Self Care | Payer: Medicare Other | Source: Ambulatory Visit | Attending: Obstetrics | Admitting: Obstetrics

## 2021-04-20 ENCOUNTER — Other Ambulatory Visit: Payer: Self-pay

## 2021-04-20 DIAGNOSIS — M810 Age-related osteoporosis without current pathological fracture: Secondary | ICD-10-CM | POA: Insufficient documentation

## 2021-04-20 MED ORDER — ZOLEDRONIC ACID 5 MG/100ML IV SOLN: 5.0000 mg | Freq: Once | INTRAVENOUS | Status: AC

## 2021-04-20 MED ORDER — ZOLEDRONIC ACID 5 MG/100ML IV SOLN
INTRAVENOUS | Status: AC
  Administered 2021-04-20: 5 mg via INTRAVENOUS
  Filled 2021-04-20: qty 100

## 2021-05-04 ENCOUNTER — Other Ambulatory Visit: Payer: Self-pay

## 2021-05-04 ENCOUNTER — Encounter: Payer: Self-pay | Admitting: Physician Assistant

## 2021-05-04 ENCOUNTER — Ambulatory Visit (INDEPENDENT_AMBULATORY_CARE_PROVIDER_SITE_OTHER): Payer: Medicare Other | Admitting: Physician Assistant

## 2021-05-04 DIAGNOSIS — L570 Actinic keratosis: Secondary | ICD-10-CM | POA: Diagnosis not present

## 2021-05-04 MED ORDER — TRETINOIN 0.1 % EX CREA: TOPICAL_CREAM | Freq: Every day | CUTANEOUS | 6 refills | Status: DC

## 2021-05-04 NOTE — Progress Notes (Signed)
   Follow-Up Visit   Subjective  Madeline Caldwell is a 75 y.o. female who presents for the following: Annual Exam (Face check refused gown).She has had numerous precancers and skin cancers removed in the past. Still rough patches that she has been using tretinoin to treat. She thinks that it is helping.   The following portions of the chart were reviewed this encounter and updated as appropriate:  Tobacco  Allergies  Meds  Problems  Med Hx  Surg Hx  Fam Hx       Objective  Well appearing patient in no apparent distress; mood and affect are within normal limits.  A focused examination was performed including face. Relevant physical exam findings are noted in the Assessment and Plan.  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  The documentation on 05/04/21 for the exam, diagnosis, procedures and orders are all accurate and complete.

## 2021-06-13 ENCOUNTER — Telehealth: Payer: Self-pay | Admitting: Physician Assistant

## 2021-06-13 DIAGNOSIS — L570 Actinic keratosis: Secondary | ICD-10-CM

## 2021-06-13 MED ORDER — TRETINOIN 0.1 % EX CREA: TOPICAL_CREAM | Freq: Every day | CUTANEOUS | 3 refills | Status: AC

## 2021-06-13 NOTE — Telephone Encounter (Signed)
Pt wants refills for Retin-[facility]. Pt requested 3 tubes with 3 refills

## 2021-06-13 NOTE — Telephone Encounter (Signed)
Prescription for Retin-A escribed to [facility] per patient's request.

## 2021-11-22 IMAGING — CT CT BIOPSY
1 of 5 series · 10 of 32 positions shown, 16 images · non-contrast
Comparison: none

CLINICAL DATA: Right sacroiliac pain.

[Series 2: needle -guided injection · axial · 0.68mm/px · z∈[-113,-29]mm · 10 of 52 slices shown, 16 images]
[im 5/52  soft-tissue]
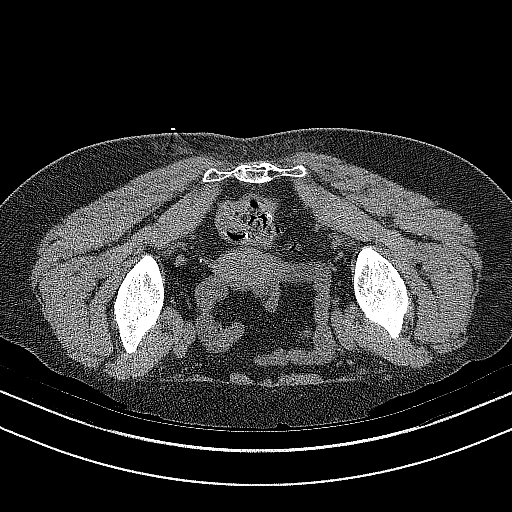
[im 5/52  bone]
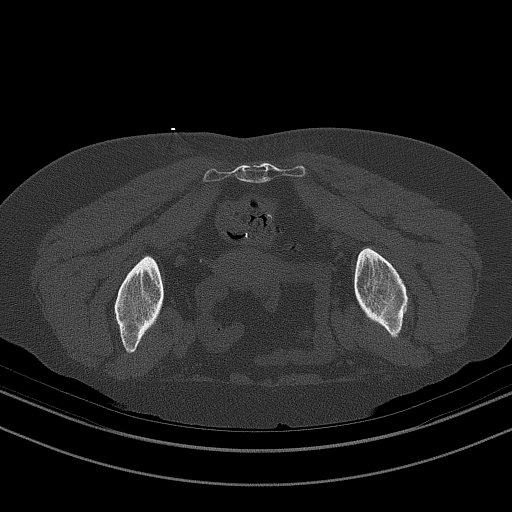
[im 10/52  soft-tissue]
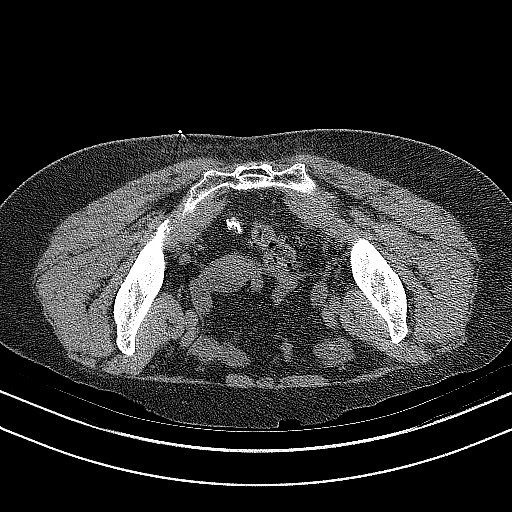
[im 14/52  soft-tissue]
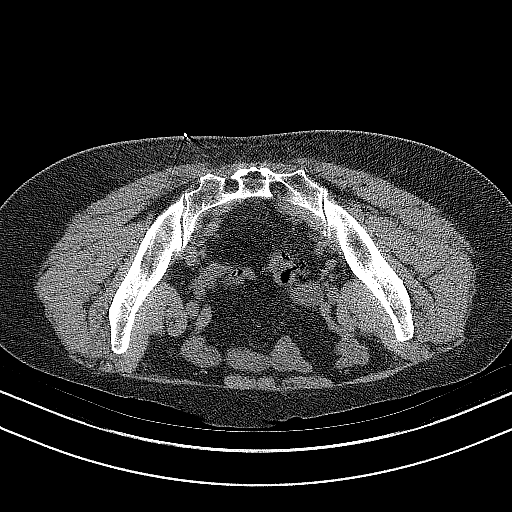
[im 19/52  soft-tissue]
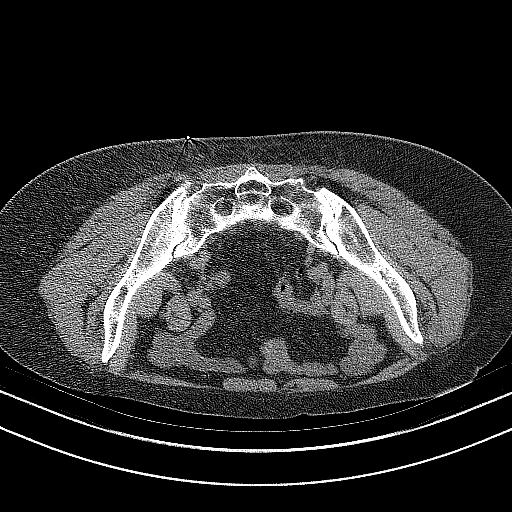
[im 24/52  soft-tissue]
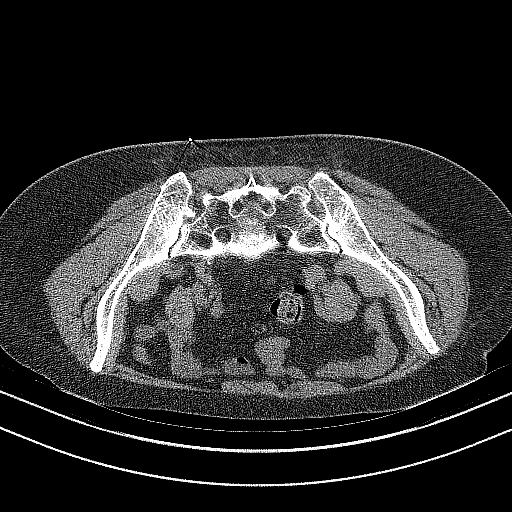
[im 28/52  soft-tissue]
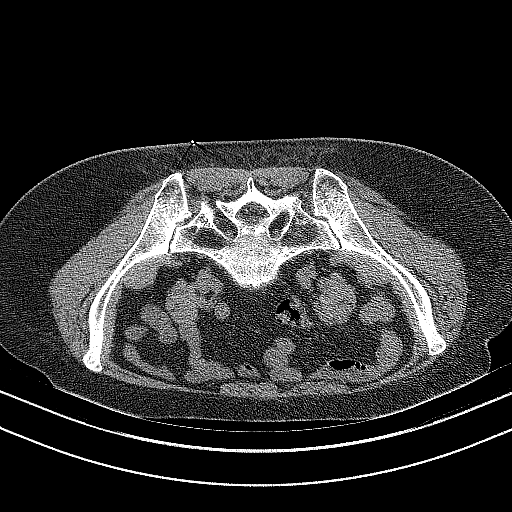
[im 33/52  soft-tissue]
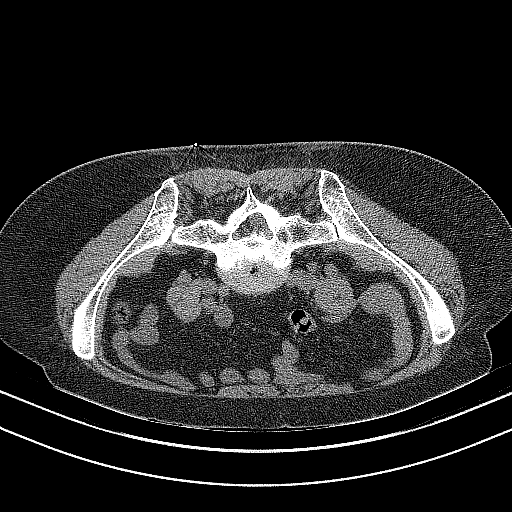
[im 33/52  lung]
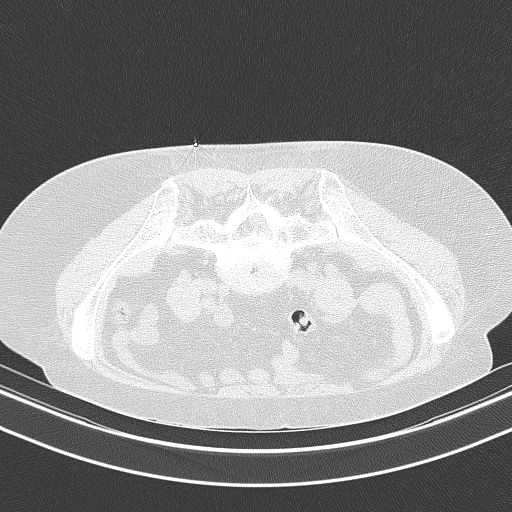
[im 38/52  soft-tissue]
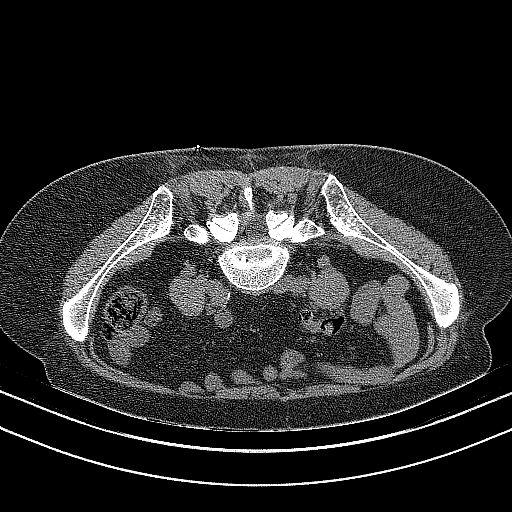
[im 38/52  lung]
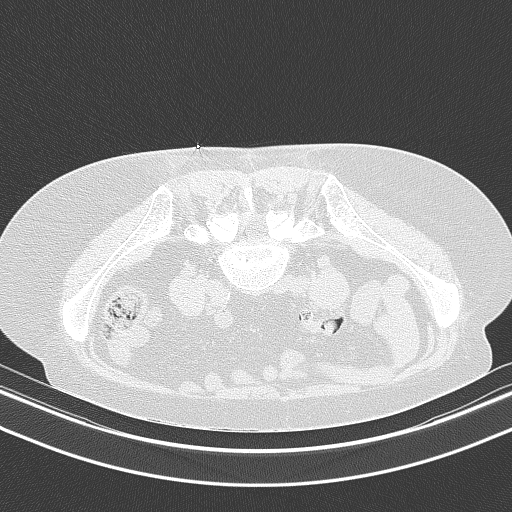
[im 42/52  soft-tissue]
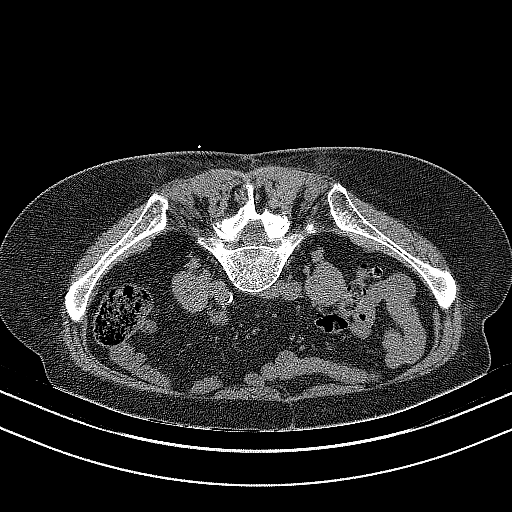
[im 42/52  lung]
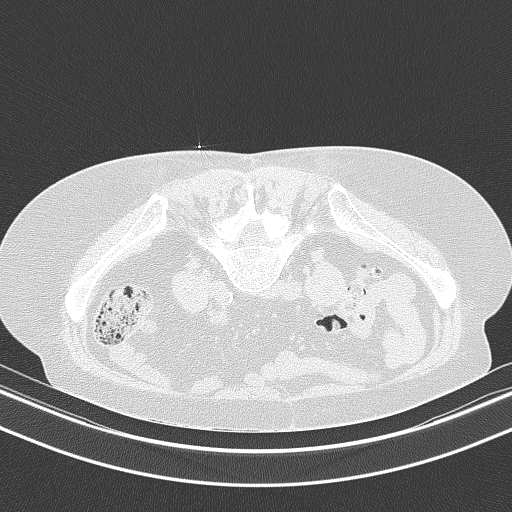
[im 42/52  bone]
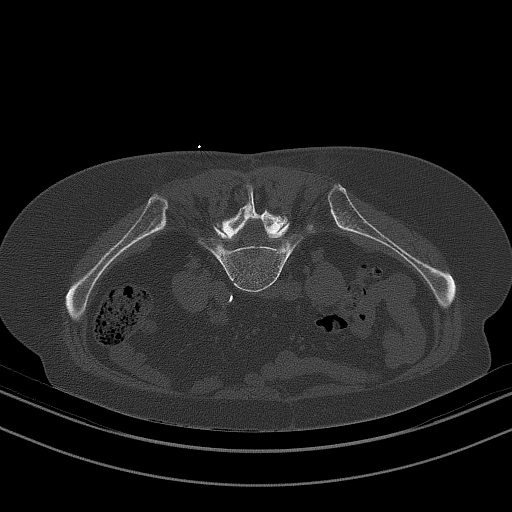
[im 47/52  soft-tissue]
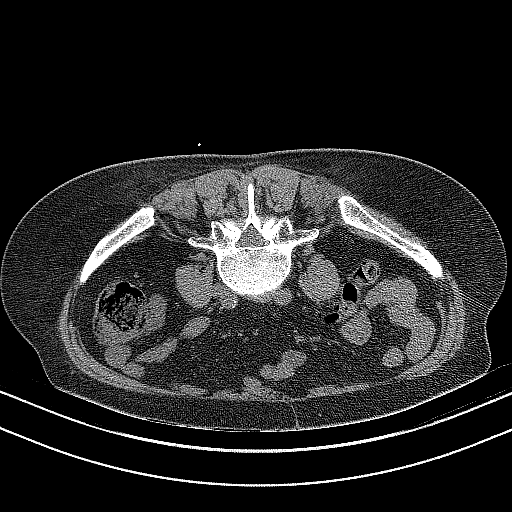
[im 47/52  lung]
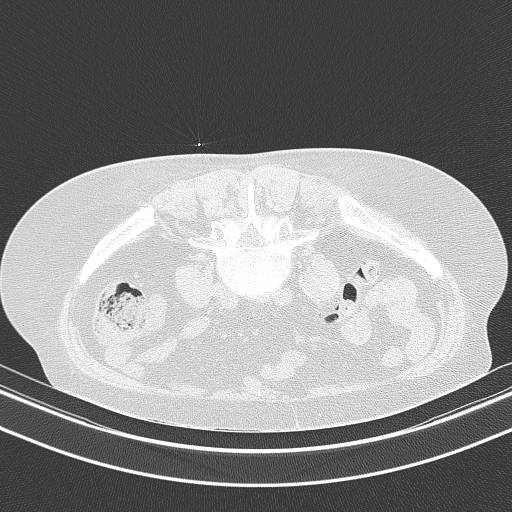

[10 of 32 positions shown; findings below may reference images not displayed]

EXAM:
CT GUIDED RIGHT SI JOINT INJECTION

PROCEDURE:
After a thorough discussion of risks and benefits of the procedure,
including bleeding, infection, injury to nerves, blood vessels, and
adjacent structures, verbal and written consent was obtained. The
patient was placed prone on the CT table and localization was
performed over the sacrum. Target site marked using CT guidance. The
skin was prepped and draped in the usual sterile fashion using
Betadine soap.

After local anesthesia with 1% lidocaine without epinephrine and
subsequent deep anesthesia, a 3.5 inch 22 gauge spinal needle was
advanced into the right SI joint under intermittent CT guidance.

Injection of a small amount of Isovue-M 200 demonstrated
intra-articular contrast spread. Subsequently, 2 mL of 0.5%
bupivacaine were injected into the right SI joint. The needle was
removed and a sterile dressing applied.

No complications were observed.
IMPRESSION: Successful CT-guided right SI joint injection.

## 2021-12-12 ENCOUNTER — Telehealth: Payer: Self-pay | Admitting: Physician Assistant

## 2021-12-12 ENCOUNTER — Ambulatory Visit (INDEPENDENT_AMBULATORY_CARE_PROVIDER_SITE_OTHER): Payer: Medicare Other | Admitting: Physician Assistant

## 2021-12-12 ENCOUNTER — Encounter: Payer: Self-pay | Admitting: Physician Assistant

## 2021-12-12 ENCOUNTER — Other Ambulatory Visit: Payer: Self-pay

## 2021-12-12 DIAGNOSIS — Z85828 Personal history of other malignant neoplasm of skin: Secondary | ICD-10-CM | POA: Diagnosis not present

## 2021-12-12 DIAGNOSIS — D485 Neoplasm of uncertain behavior of skin: Secondary | ICD-10-CM | POA: Diagnosis not present

## 2021-12-12 DIAGNOSIS — Z1283 Encounter for screening for malignant neoplasm of skin: Secondary | ICD-10-CM

## 2021-12-12 DIAGNOSIS — L570 Actinic keratosis: Secondary | ICD-10-CM | POA: Diagnosis not present

## 2021-12-12 NOTE — Telephone Encounter (Signed)
I am unaware of anyone that is outstanding. Sorry.

## 2021-12-12 NOTE — Progress Notes (Signed)
° °  Follow-Up Visit   Subjective  Madeline Caldwell is a 76 y.o. female who presents for the following: Skin Problem (Pt here to get some spots of concern evaluated on the nose, lip and upper chest. Pt states that the nose has been bleeding. Pt has personal hx of skin cancer. No family hx of skin cancer of melanoma ).   The following portions of the chart were reviewed this encounter and updated as appropriate:  Tobacco   Meds   Problems   Med Hx   Surg Hx   Fam Hx       Objective  Well appearing patient in no apparent distress; mood and affect are within normal limits.  All skin waist up examined.  Dorsum of Nose Hyperkeratotic scale with pink base        Right Upper Cutaneous Lip Crusted pink papule       right chest Erythematous patches with gritty scale.   Assessment & Plan  Neoplasm of uncertain behavior of skin (2) Dorsum of Nose  Skin / nail biopsy Type of biopsy: tangential   Informed consent: discussed and consent obtained   Timeout: patient name, date of birth, surgical site, and procedure verified   Procedure prep:  Patient was prepped and draped in usual sterile fashion (Non sterile) Prep type:  Chlorhexidine Anesthesia: the lesion was anesthetized in a standard fashion   Anesthetic:  1% lidocaine w/ epinephrine 1-100,000 local infiltration Instrument used: flexible razor blade   Hemostasis achieved with: aluminum chloride and electrodesiccation   Outcome: patient tolerated procedure well   Post-procedure details: wound care instructions given    Specimen 1 - Surgical pathology Differential Diagnosis: bcc vs scc  Check Margins: No  Right Upper Cutaneous Lip  Skin / nail biopsy Type of biopsy: tangential   Informed consent: discussed and consent obtained   Timeout: patient name, date of birth, surgical site, and procedure verified   Procedure prep:  Patient was prepped and draped in usual sterile fashion (Non sterile) Prep type:   Chlorhexidine Anesthesia: the lesion was anesthetized in a standard fashion   Anesthetic:  1% lidocaine w/ epinephrine 1-100,000 local infiltration Instrument used: flexible razor blade   Hemostasis achieved with: aluminum chloride and electrodesiccation   Outcome: patient tolerated procedure well   Post-procedure details: wound care instructions given    Specimen 2 - Surgical pathology Differential Diagnosis: bcc vs scc  Check Margins: No  AK (actinic keratosis) right chest  Destruction of lesion - right chest Complexity: simple   Destruction method: cryotherapy   Informed consent: discussed and consent obtained   Timeout:  patient name, date of birth, surgical site, and procedure verified Lesion destroyed using liquid nitrogen: Yes   Cryotherapy cycles:  3 Outcome: patient tolerated procedure well with no complications      I, Neliah Cuyler, PA-C, have reviewed all documentation's for this visit.  The documentation on 12/12/21 for the exam, diagnosis, procedures and orders are all accurate and complete.

## 2021-12-12 NOTE — Patient Instructions (Signed)

## 2021-12-12 NOTE — Telephone Encounter (Signed)
Phone call to patient with [facility] recommendations. Voicemail left for patient to give the office a call back.

## 2021-12-12 NOTE — Telephone Encounter (Signed)
Here today.Wants to know if [facility] can recommend a neuro-muscular doctor in [place].

## 2022-01-04 ENCOUNTER — Telehealth: Payer: Self-pay | Admitting: Physician Assistant

## 2022-01-04 NOTE — Telephone Encounter (Signed)
Phone call to patient with her pathology results. Patient aware of results.  

## 2022-01-04 NOTE — Telephone Encounter (Signed)
Patient left message on office voice mail that she was calling for pathology results from her last visit with [facility], PA-C.

## 2022-05-10 ENCOUNTER — Ambulatory Visit: Payer: Medicare Other | Admitting: Physician Assistant

## 2022-07-02 ENCOUNTER — Telehealth: Payer: Self-pay | Admitting: Physician Assistant

## 2022-07-02 NOTE — Telephone Encounter (Signed)
Patient left message on office voice mail that she wanted to get 3 months supply of Retin A  for 1 year [place], [place] - [address] (Ph: [phone])

## 2022-07-03 MED ORDER — TRETINOIN 0.1 % EX CREA: TOPICAL_CREAM | Freq: Every day | CUTANEOUS | 4 refills | Status: DC

## 2022-07-03 NOTE — Addendum Note (Signed)
Addended by: Sheran Lawless on: 07/03/2022 09:30 AM   Modules accepted: Orders

## 2022-07-11 ENCOUNTER — Ambulatory Visit: Payer: Medicare Other | Admitting: Physician Assistant

## 2022-07-12 ENCOUNTER — Telehealth: Payer: Self-pay | Admitting: *Deleted

## 2022-07-12 MED ORDER — TRETINOIN 0.1 % EX CREA: TOPICAL_CREAM | Freq: Every day | CUTANEOUS | 8 refills | Status: AC

## 2022-07-12 NOTE — Telephone Encounter (Signed)
Phone call from patient stating we sent tretinoin prescription to wrong pharmacy. Resent to patient pharmacy.

## 2024-04-09 ENCOUNTER — Encounter: Payer: Self-pay | Admitting: Neurology

## 2024-04-22 ENCOUNTER — Other Ambulatory Visit: Payer: Self-pay

## 2024-04-22 DIAGNOSIS — R202 Paresthesia of skin: Secondary | ICD-10-CM

## 2024-05-01 ENCOUNTER — Ambulatory Visit (INDEPENDENT_AMBULATORY_CARE_PROVIDER_SITE_OTHER): Admitting: Neurology

## 2024-05-01 DIAGNOSIS — R202 Paresthesia of skin: Secondary | ICD-10-CM

## 2024-05-01 DIAGNOSIS — G5621 Lesion of ulnar nerve, right upper limb: Secondary | ICD-10-CM

## 2024-05-01 NOTE — Procedures (Signed)
 [facility]  [address] [place], [address]  [place], [place] [address] Tel: [phone] Fax: [phone] Test Date:  05/01/2024  Patient: Madeline Caldwell DOB: [birth year] Physician: Reyna Cava, DO  Sex: Female Height: 5\' 2"  Ref Phys: Garry [place], MD  ID#: [id number]   Technician:    History: This is a 78 year old female with history of CTS release referred for evaluation of bilateral upper extremity paresthesias.  NCV & EMG Findings: Extensive electrodiagnostic testing of the right upper extremity and additional studies of the left shows:  Bilateral median, ulnar, and mixed palmar sensory responses are within normal limits. Bilateral median and left ulnar motor responses within normal limits.  Right ulnar motor response shows slowed conduction velocity across the elbow (A Elbow-B Elbow, 48 m/s).   There is no evidence of active or chronic motor axonal loss changes affecting any of the tested muscles.  Motor unit configuration and recruitment pattern is within normal limits.    Impression: Right ulnar neuropathy with slowing across the elbow, demyelinating, mild.   ___________________________ Reyna Cava, DO    Nerve Conduction Studies   Stim Site NR Peak (ms) Norm Peak (ms) O-P Amp (V) Norm O-P Amp  Left Median Anti Sensory (2nd Digit)  32 C  Wrist    3.0 <3.8 38.8 >10  Right Median Anti Sensory (2nd Digit)  32 C  Wrist    3.0 <3.8 33.1 >10  Left Ulnar Anti Sensory (5th Digit)  32 C  Wrist    2.9 <3.2 33.1 >5  Right Ulnar Anti Sensory (5th Digit)  32 C  Wrist    2.7 <3.2 28.7 >5     Stim Site NR Onset (ms) Norm Onset (ms) O-P Amp (mV) Norm O-P Amp Site1 Site2 Delta-0 (ms) Dist (cm) Vel (m/s) Norm Vel (m/s)  Left Median Motor (Abd Poll Brev)  32 C  Wrist    2.7 <4.0 5.6 >5 Elbow Wrist 4.9 28.0 57 >50  Elbow    7.6  5.4         Right Median Motor (Abd Poll Brev)  32 C  Wrist    2.7 <4.0 5.9 >5 Elbow Wrist 5.1 31.0 61 >50  Elbow    7.8  5.5          Left Ulnar Motor (Abd Dig Minimi)  32 C  Wrist    2.3 <3.1 8.2 >7 B Elbow Wrist 3.2 20.0 63 >50  B Elbow    5.5  7.5  A Elbow B Elbow 1.5 10.0 67 >50  A Elbow    7.0  7.5         Right Ulnar Motor (Abd Dig Minimi)  32 C  Wrist    2.4 <3.1 7.0 >7 B Elbow Wrist 3.2 21.0 66 >50  B Elbow    5.6  6.9  A Elbow B Elbow 2.1 10.0 *48 >50  A Elbow    7.7  6.5            Stim Site NR Peak (ms) Norm Peak (ms) P-T Amp (V) Site1 Site2 Delta-P (ms) Norm Delta (ms)  Left Median/Ulnar Palm Comparison (Wrist - 8cm)  32 C  Median Palm    1.6 <2.2 95.6 Median Palm Ulnar Palm 0.1   Ulnar Palm    1.5 <2.2 24.7      Right Median/Ulnar Palm Comparison (Wrist - 8cm)  32 C  Median Palm    1.9 <2.2 68.3 Median Palm Ulnar Palm 0.3   Ulnar  Palm    1.6 <2.2 12.3       Electromyography   Side Muscle Ins.Act Fibs Fasc Recrt Amp Dur Poly Activation Comment  Left Biceps Nml Nml Nml Nml Nml Nml Nml Nml N/A  Left Triceps Nml Nml Nml Nml Nml Nml Nml Nml N/A  Left PronatorTeres Nml Nml Nml Nml Nml Nml Nml Nml N/A  Left Deltoid Nml Nml Nml Nml Nml Nml Nml Nml N/A  Left 1stDorInt Nml Nml Nml Nml Nml Nml Nml Nml N/A  Right 1stDorInt Nml Nml Nml Nml Nml Nml Nml Nml N/A  Right Abd Poll Brev Nml Nml Nml Nml Nml Nml Nml Nml N/A  Right Abd Dig Min Nml Nml Nml Nml Nml Nml Nml Nml N/A  Right FlexCarpiUln Nml Nml Nml Nml Nml Nml Nml Nml N/A  Right PronatorTeres Nml Nml Nml Nml Nml Nml Nml Nml N/A  Right Biceps Nml Nml Nml Nml Nml Nml Nml Nml N/A  Right Triceps Nml Nml Nml Nml Nml Nml Nml Nml N/A  Right Deltoid Nml Nml Nml Nml Nml Nml Nml Nml N/A      Waveforms:

## 2024-05-22 ENCOUNTER — Encounter: Admitting: Neurology
